# Patient Record
Sex: Male | Born: 1940 | Race: White | Hispanic: No | Marital: Married
Health system: Midwestern US, Academic
[De-identification: ages and names within clinical notes are randomized; demographics above are authoritative.]

---

## 2016-07-14 ENCOUNTER — Ambulatory Visit: Admit: 2016-07-14 | Discharge: 2016-07-14 | Payer: MEDICARE

## 2016-07-14 ENCOUNTER — Encounter: Admit: 2016-07-14 | Discharge: 2016-07-14 | Payer: MEDICARE

## 2016-07-14 DIAGNOSIS — Z8679 Personal history of other diseases of the circulatory system: ICD-10-CM

## 2016-07-14 DIAGNOSIS — E7800 Pure hypercholesterolemia, unspecified: ICD-10-CM

## 2016-07-14 DIAGNOSIS — Z95810 Presence of automatic (implantable) cardiac defibrillator: Principal | ICD-10-CM

## 2016-07-14 DIAGNOSIS — I10 Essential (primary) hypertension: ICD-10-CM

## 2016-07-14 DIAGNOSIS — Z006 Encounter for examination for normal comparison and control in clinical research program: ICD-10-CM

## 2016-07-14 DIAGNOSIS — Z905 Acquired absence of kidney: ICD-10-CM

## 2016-07-14 DIAGNOSIS — I447 Left bundle-branch block, unspecified: ICD-10-CM

## 2016-07-14 DIAGNOSIS — E785 Hyperlipidemia, unspecified: ICD-10-CM

## 2016-07-14 DIAGNOSIS — I480 Paroxysmal atrial fibrillation: ICD-10-CM

## 2016-07-14 DIAGNOSIS — I2510 Atherosclerotic heart disease of native coronary artery without angina pectoris: Principal | ICD-10-CM

## 2016-07-14 DIAGNOSIS — Z7901 Long term (current) use of anticoagulants: ICD-10-CM

## 2016-07-14 NOTE — Progress Notes
Date of Service: 07/14/2016    Justin Farmer is a 76 y.o. male.       HPI     Justin Farmer presents to my office today in electrophysiology follow-up for a history of cardiomyopathy and defibrillator.  As you know, he is a very pleasant 76 year old male with a past medical history of coronary artery disease, ischemic cardiomyopathy with a last estimated ejection fraction of 40%, paroxysmal atrial fibrillation currently on anticoagulation, hyperlipidemia, and hypertension who was last seen by his primary cardiologist Dr. Rodrigo Ran in October.  At that time, the patient was doing well without any significant complaints.  He made plans to see Mr. Wease back in follow-up in 1 year.    [name] is in my office today in routine follow-up.  He is a participant in our Wrap It study looking at the risk of infection with device implantation.  He tells me that he has had no chest pain, shortness of breath, dizziness, or syncope.  He denies palpitations.  Occasionally, he will feel his heartbeat funny at night.  His energy level is good.  Patient denies fevers, chills, and sweats.  Device interrogation today shows stable sensing and pacing thresholds.  He is 99% V paced.         Vitals:    07/14/16 0849   BP: 122/77   Pulse: 60   Weight: 92.7 kg (204 lb 6.4 oz)   Height: 1.778 m (5' 10)     Body mass index is 29.33 kg/m???.     Past Medical History  Patient Active Problem List    Diagnosis Date Noted   ??? Biventricular automatic implantable cardioverter defibrillator in situ 07/14/2016   ??? Coronary artery disease involving native coronary artery of native heart without angina pectoris 12/08/2014     1. 17/07 pharmacologic stress study: Mildly abnormal with slight degree of ischemia in mid to distal inferolateral wall in circumflex distribution. EF 49%, although visually appeared to be 55%. EDV 87 mL.  2. 02/14/05 cardiac catheterization: LAD proximal 40-50% stenosis, proximal mid portion 60% followed by 70-75% stenosis between the first and second diagonal branches. Second diagonal branch 60% ostial stenosis. Circumflex first OM with proximal 50-60% stenosis. RCA 20-30% luminal irregularity in the proximal mid segment. Normal end-diastolic pressure 6-8 mmHg. Left ventriculogram not performed.  3. 05/08/05 coronary angiogram: Left main free of significant disease, LAD proximal 30-40% stenosis followed by 70% mid stenosis between the first and second diagonal branches, 50% stenosis in the LAD at bifurcation or second diagonal branch. Second diagonal branch ostial 60% stenosis. Left circumflex no significant disease. Large obtuse marginal branch proximal 40% stenosis. Right coronary artery 20% luminal narrowing in mid RCA. FFR proximal-mid LAD at baseline 0.90, at maximal hyperemia 0.72. FFR of first obtuse marginal 0.97 at rest, decreased to 0.91 with maximum hyperemia with adenosine.??? Status post PCI and stenting of proximal to mid left anterior descending artery with 3.0- x 30.0-mm Driver bare-metal stent.  4. 05/11/2006, exercise thallium.??? The patient exercised for 8:33 minutes,achieving 89 percent of MPHR.??? Stress thallium negative for ischemia.??? His stress ECG negative for ischemia.??? EF 59 %.  5. 06/08/08: [facility] stress thallium: No significant myocardial ischemia. There is diaphragmatic attenuation/and due to hot spot artifact involving the inferior wall. EF 66%  6. 07/18/11: Probably abnormal and demonstrates a relatively small, reversible anteroseptal and apical septal defect. There is also a fixed, true apical component noted. This may suggest limited ischemia in an LAD distribution. EF 53%  7. 08/03/11: Cardiac Cath: Moderate 70% lesion right after the previously placed stent in the proximal LAD.FFR dropped from 0.86 to 0.67. Moderate 40 percent to 50 percent lesion in the large first obtuse marginal vessel, mild 30 percent lesion in the proximal to mid RCA.LAD- stented using a bare-metal stent, Integrity 3.0 x 12 mm and followed by another stent of 2.5 x 12 mm in overlapping fashion.  8. 08/25/11 Echo:??? EF 40-45%.??? Mild left atrial enlargement.??? Mild concentric LVH.??? Calcific thickening of noncoronary cusp of aortic valve with no stenosis or ???regurgitation.??? PAP=34 mmHg.  9. 06/09/13 Regadenoson Thallium:??? EF 42%.??? No definite perfusion defects.     ??? Chronic anticoagulation 12/08/2014   ??? 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 08/25/11: Echo:EF=40- 45%. Mild left atrial enlargement. Mild LVH. Calcific thickening of noncoronary cusp of aortic valve. No stenosis or regurgitation. PAP 34  2. 10/06/12 Echo:  EF 45%.  LA mildly enlarged.  3. 10/06/12: Echo: LV EF is about 45%. The LA is mildly enlarged. Mild MR/TR  4. 12/08/14 Echo:  EF 35%. Sclerotic AV with not stenosis or regurgitation.  Mildly dilated sinuses of valsalva -3.7 cm. PAP=27 mmHg.  5. 01/02/2015 CRT-D implant  6. 07/19/15 2D Echo:  EF 40%.  Sclerotic AV.  Mildly dilated sinuses al valsalva (3.9 cm)       ??? Abnormal cardiovascular stress test 07/31/2011   ??? Abnormal thallium stress test 07/31/2011   ??? Paroxysmal atrial fibrillation (HCC) 02/22/2011     01/2011: TEE: EF 40-45%. Moderate left atrial enlargement. Mild right atrial enlargement. Mild MR/TR. No thrombus is noted in the left atrial appendage or the left atrium.   02/21/11: Cardioversion: 200 J biphasic  INR followed by PCP, Lynne Leader MD - as of 08/05/11  11/06/14 Holter Monitor:  46 hour Holter monitor demonstrates sinus rhythm with rare premature atrial complex beats (21 in 46 hours) and rare premature ventricular     complex beats (29 in 46 hours).??? No episode of significant atrial or ventricular sustained arrhythmia.??? No symptoms reported.  ???     ??? LBBB (left bundle branch block) 06/05/2009     05/2008: rate related during stress test and noted again on 06/01/09 at rate of 54 bpm ??? Essential hypertension 06/07/2008   ??? 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 HLD (hyperlipidemia) 06/07/2008         Review of Systems   Constitution: Negative.   HENT: Negative.    Eyes: Negative.    Cardiovascular: Negative.    Respiratory: Negative.    Endocrine: Negative.    Hematologic/Lymphatic: Negative.    Skin: Negative.    Musculoskeletal: Negative.    Gastrointestinal: Negative.    Genitourinary: Negative.    Neurological: Negative.    Psychiatric/Behavioral: Negative.    Allergic/Immunologic: Negative.        Physical Exam  General Appearance: elderly, well nourished in no acute distress  Skin: warm, moist, no ulcers  HEENT: extraocular movements intact, oropharynx clear  Neck Veins: neck veins are flat, neck veins are not distended  Carotid Arteries: normal carotid upstroke bilaterally, no bruits  Chest Inspection: chest is normal in appearance  Auscultation/Percussion: lungs clear to auscultation, no rales, rhonchi, or wheezing  Cardiac Rhythm: regular rhythm and normal rate  Cardiac Auscultation: Normal S1 & S2, no S3 or S4, no rub  Murmurs: no cardiac murmurs   Extremities: no lower extremity edema; 1+  symmetric distal pulses  Abdominal Exam: soft, non-tender, no masses, bowel sounds normal  Liver & Spleen: no organomegaly  Neurologic Exam: neurological assessment grossly intact      Cardiovascular Studies  12 lead EKG:  AV paced rhythm, ventricular rate 60 bpm, QTc 448 msec      Problems Addressed Today  Encounter Diagnoses   Name Primary?   ??? Biventricular automatic implantable cardioverter defibrillator in situ Yes   ??? Paroxysmal atrial fibrillation (HCC)    ??? LBBB (left bundle branch block)        Assessment and Plan       Paroxysmal atrial fibrillation (HCC)  He has not had any recent atrial fibrillation.  Mr. Snide remains on anticoagulation with warfarin.  We will continue to follow him clinically. Biventricular automatic implantable cardioverter defibrillator in situ  Device interrogation today shows stable sensing and pacing thresholds.  He is LV pacing 99.6% of the time.  He has by V pacing 0.3% of the time.  We did not make any changes to his programming.      The patient is in one of our study protocols.  He has had no pain at his incision site.  The patient denies fevers, chills, or sweats.    I have not scheduled a follow up appointment at this time.  I would be happy to see the patient back in the future if the need arises.    Current Medications (including today's revisions)  ??? aspirin EC 81 mg tablet Take 1 Tab by mouth daily.   ??? atorvastatin (LIPITOR) 20 mg tablet Take 20 mg by mouth daily.     ??? HYDROcodone/acetaminophen (NORCO) 5/325 mg tablet Take 1-2 Tabs by mouth every 4 hours as needed for Pain   ??? losartan(+) (COZAAR) 100 mg tablet Take 1 tablet by mouth daily.   ??? metoprolol XL (TOPROL XL) 200 mg extended release tablet Take 1 tablet by mouth daily.   ??? nitroglycerin (NITROSTAT) 0.4 mg tablet Place 1 Tab under tongue Every 5 Min as needed for Chest Pain.   ??? tamsulosin (FLOMAX) 0.4 mg capsule Take 1 Cap by mouth Daily.   ??? VIT C/VIT E ACETATE/LUTEIN/MIN (OCUVITE LUTEIN PO) Take 1 Tab by mouth daily.   ??? warfarin (COUMADIN) 5 mg tablet Take 1 Tab by mouth at bedtime daily. as directed based on INR.

## 2016-07-14 NOTE — Assessment & Plan Note
He has not had any recent atrial fibrillation.  Justin Farmer remains on anticoagulation with warfarin.  We will continue to follow him clinically.

## 2016-07-25 ENCOUNTER — Ambulatory Visit: Admit: 2016-07-25 | Discharge: 2016-07-26 | Payer: MEDICARE

## 2016-07-26 DIAGNOSIS — Z95810 Presence of automatic (implantable) cardiac defibrillator: Principal | ICD-10-CM

## 2016-08-29 NOTE — Progress Notes
Date of Service: 08/29/2016    Justin Farmer is a 76 y.o. male.       HPI     [name] is a very pleasant 76 year old male with a history of coronary artery disease status post stenting of the left anterior descending artery with bare metal stent in 2007.  A cardiac catheterization in 2013 after an abnormal stress test was done, which demonstrated significant stenosis in the left anterior descending artery again, and he underwent stenting with overlapping 2 stents.  He has done well since then.  The ejection fraction has been about 42% on the last stress test in 2015, which did not demonstrate any significant reversible perfusion abnormalities.  Patient's left ventricular ejection fraction decreased to about 35% in 2016.  He underwent a biventricular pacing device.  His left ventricular systolic function improved and was 40% on the last check.  He came today for followup.  [place]  The patient was very emotional today, as his wife recently died.  They were married for about 58 years.  She has been followed in our clinic as well.  She suddenly had a major stroke and was in ICU for about a week, another week in the hospital.  Patient was feeling somewhat depressed and emotional and was crying at times.  His blood pressure at home has been about 130/70 to 80 mmHg.  He stated his INR has been checked through your office, and it was 3.0 on the last check.  No complaint of blood in the stool or black stool.     Patient stated his son lives nearby and he checks on him on a daily basis and also another son lives in [place] who calls him every day.  He just signed up for an emergency alarm yesterday (Life Alert).  Cholesterol was checked last year and was 147 with the LDL of 70.  He is taking all of his medications regularly and denies any muscle ache or side effect from Lipitor.     EKG demonstrates atrial paced rhythm and old inferior infarct and poor R-wave progression in anterior leads. CRT-D was checked in July, which has been functioning normally.  He is Bi-V paced 98% of the time.  There were 5 V sense episodes with the longest one for about 1 minute and 20 seconds.  The marker suggested sinus tachycardia.  PVC burden is 3.3 hours per day.     1. Coronary artery disease.  The patient denies any complaint of chest pain or shortness of breath.  He still has significant PVC burden on the device check.  His last stress test was done in 2015, and ejection fraction has been about 40% on the last echo and stress test.  We will schedule him for a pharmacological stress thallium study for further evaluation.  He is to continue with the losartan and Toprol-XL.  He is already on max doses.  He is on aspirin as well.  I encouraged him to exercise.  He has a farm and stays fairly active.  His activities are now limited as his wife passed away recently.  2. History of paroxysmal atrial fibrillation.  The patient is doing well clinically.  No high atrial rate events noted on the device check.  He is to continue with the Coumadin with a target INR between 2 and 3.  INR has been checked through your office.  No complaint of blood in the stool or black stool.  3. Hypertension.  Blood pressure is under control.  He is to continue the losartan and Toprol-XL.  4. Hypercholesterolemia.  He is to continue with the Lipitor.  He has an appointment in your office in the next month and stated he generally gets it checked through your office.  5. Patient is significantly depressed.  He denied any suicidal ideation, but there were tears in his eyes and he was talking about his wife.  I discussed with him whether he wants to be on any antidepressant, which he does not want at present.  He stated he is dealing with it and will slowly get better.  He was more emotional as she has been followed in our clinic as well.  I advised him to call your office if he feels bad and agree with starting any antidepressant.  If he remains depressed, I think he will be better off with an antidepressant.  He has been reluctant today in the clinic, but was open to further discussion.  He has lost weight and has not been eating as well as he used to.  His son is checking on him on a regular basis.    ([id number])        We appreciate the opportunity in taking care of Justin Farmer, please feel free to contact us if there is any further question.    On examination, the patient is sitting comfortably on exam bed in no respiratory distress. No cyanosis, clubbing or icterus noted. Neck is supple. No jugular venous distention, lymphadenopathy or carotid bruit. Chest is clear to auscultation. No wheeze or crackles. Cardiovascular: S1, S2 are normal. No S3, S4 or murmur. Abdomen is soft, nontender. There is no organomegaly. Neurologically, the patient is alert, oriented times 3. Strength is normal. Extremities: No pedal edema noted. Peripheral pulses, anterior and posterior tibial are 2+.     Patient Active Problem List    Diagnosis Date Noted   ??? Biventricular automatic implantable cardioverter defibrillator in situ 07/14/2016   ??? Coronary artery disease involving native coronary artery of native heart without angina pectoris 12/08/2014     1. 17/07 pharmacologic stress study: Mildly abnormal with slight degree of ischemia in mid to distal inferolateral wall in circumflex distribution. EF 49%, although visually appeared to be 55%. EDV 87 mL.  2. 02/14/05 cardiac catheterization: LAD proximal 40-50% stenosis, proximal mid portion 60% followed by 70-75% stenosis between the first and second diagonal branches. Second diagonal branch 60% ostial stenosis. Circumflex first OM with proximal 50-60% stenosis. RCA 20-30% luminal irregularity in the proximal mid segment. Normal end-diastolic pressure 6-8 mmHg. Left ventriculogram not performed. 3. 05/08/05 coronary angiogram: Left main free of significant disease, LAD proximal 30-40% stenosis followed by 70% mid stenosis between the first and second diagonal branches, 50% stenosis in the LAD at bifurcation or second diagonal branch. Second diagonal branch ostial 60% stenosis. Left circumflex no significant disease. Large obtuse marginal branch proximal 40% stenosis. Right coronary artery 20% luminal narrowing in mid RCA. FFR proximal-mid LAD at baseline 0.90, at maximal hyperemia 0.72. FFR of first obtuse marginal 0.97 at rest, decreased to 0.91 with maximum hyperemia with adenosine.??? Status post PCI and stenting of proximal to mid left anterior descending artery with 3.0- x 30.0-mm Driver bare-metal stent.  4. 05/11/2006, exercise thallium.??? The patient exercised for 8:33 minutes,achieving 89 percent of MPHR.??? Stress thallium negative for ischemia.??? His stress ECG negative for ischemia.??? EF 59 %.  5. 06/08/08: Lexicon stress thallium: No significant myocardial ischemia. There is diaphragmatic  attenuation/and due to hot spot artifact involving the inferior wall. EF 66%  6. 07/18/11: Probably abnormal and demonstrates a relatively small, reversible anteroseptal and apical septal defect. There is also a fixed, true apical component noted. This may suggest limited ischemia in an LAD distribution. EF 53%  7. 08/03/11: Cardiac Cath: Moderate 70% lesion right after the previously placed stent in the proximal LAD.FFR dropped from 0.86 to 0.67. Moderate 40 percent to 50 percent lesion in the large first obtuse marginal vessel, mild 30 percent lesion in the proximal to mid RCA.LAD- stented using a bare-metal stent, Integrity 3.0 x 12 mm and followed by another stent of 2.5 x 12 mm in overlapping fashion.  8. 08/25/11 Echo:??? EF 40-45%.??? Mild left atrial enlargement.??? Mild concentric LVH.??? Calcific thickening of noncoronary cusp of aortic valve with no stenosis or ???regurgitation.??? PAP=34 mmHg. 9. 06/09/13 Regadenoson Thallium:??? EF 42%.??? No definite perfusion defects.     ??? Chronic anticoagulation 12/08/2014   ??? 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 08/25/11: Echo:EF=40- 45%. Mild left atrial enlargement. Mild LVH. Calcific thickening of noncoronary cusp of aortic valve. No stenosis or regurgitation. PAP 34  2. 10/06/12 Echo:  EF 45%.  LA mildly enlarged.  3. 10/06/12: Echo: LV EF is about 45%. The LA is mildly enlarged. Mild MR/TR  4. 12/08/14 Echo:  EF 35%. Sclerotic AV with not stenosis or regurgitation.  Mildly dilated sinuses of valsalva -3.7 cm. PAP=27 mmHg.  5. 01/02/2015 CRT-D implant  6. 07/19/15 2D Echo:  EF 40%.  Sclerotic AV.  Mildly dilated sinuses al valsalva (3.9 cm)       ??? Abnormal cardiovascular stress test 07/31/2011   ??? Abnormal thallium stress test 07/31/2011   ??? Paroxysmal atrial fibrillation (HCC) 02/22/2011     01/2011: TEE: EF 40-45%. Moderate left atrial enlargement. Mild right atrial enlargement. Mild MR/TR. No thrombus is noted in the left atrial appendage or the left atrium.   02/21/11: Cardioversion: 200 J biphasic  INR followed by PCP, Lynne Leader MD - as of 08/05/11  11/06/14 Holter Monitor:  46 hour Holter monitor demonstrates sinus rhythm with rare premature atrial complex beats (21 in 46 hours) and rare premature ventricular     complex beats (29 in 46 hours).??? No episode of significant atrial or ventricular sustained arrhythmia.??? No symptoms reported.  ???     ??? LBBB (left bundle branch block) 06/05/2009     05/2008: rate related during stress test and noted again on 06/01/09 at rate of 54 bpm     ??? Essential hypertension 06/07/2008   ??? 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 HLD (hyperlipidemia) 06/07/2008              Vitals:    08/29/16 1046   BP: 116/82   Pulse: 60   Weight: 92.4 kg (203 lb 12.8 oz)   Height: 1.778 m (5' 10)     Body mass index is 29.24 kg/m???.     Past Medical History  Patient Active Problem List Diagnosis Date Noted   ??? Biventricular automatic implantable cardioverter defibrillator in situ 07/14/2016   ??? Coronary artery disease involving native coronary artery of native heart without angina pectoris 12/08/2014     10. 17/07 pharmacologic stress study: Mildly abnormal with slight degree of ischemia in mid to distal inferolateral wall in circumflex distribution. EF 49%, although visually appeared to be 55%. EDV 87 mL.  11. 02/14/05 cardiac catheterization: LAD proximal 40-50% stenosis, proximal mid portion 60% followed by 70-75% stenosis between the first and second diagonal branches. Second diagonal branch 60% ostial stenosis. Circumflex first OM with proximal 50-60% stenosis. RCA 20-30% luminal irregularity in the proximal mid segment. Normal end-diastolic pressure 6-8 mmHg. Left ventriculogram not performed.  12. 05/08/05 coronary angiogram: Left main free of significant disease, LAD proximal 30-40% stenosis followed by 70% mid stenosis between the first and second diagonal branches, 50% stenosis in the LAD at bifurcation or second diagonal branch. Second diagonal branch ostial 60% stenosis. Left circumflex no significant disease. Large obtuse marginal branch proximal 40% stenosis. Right coronary artery 20% luminal narrowing in mid RCA. FFR proximal-mid LAD at baseline 0.90, at maximal hyperemia 0.72. FFR of first obtuse marginal 0.97 at rest, decreased to 0.91 with maximum hyperemia with adenosine.??? Status post PCI and stenting of proximal to mid left anterior descending artery with 3.0- x 30.0-mm Driver bare-metal stent.  13. 05/11/2006, exercise thallium.??? The patient exercised for 8:33 minutes,achieving 89 percent of MPHR.??? Stress thallium negative for ischemia.??? His stress ECG negative for ischemia.??? EF 59 %.  14. 06/08/08: Lexicon stress thallium: No significant myocardial ischemia. There is diaphragmatic attenuation/and due to hot spot artifact involving the inferior wall. EF 66% 15. 07/18/11: Probably abnormal and demonstrates a relatively small, reversible anteroseptal and apical septal defect. There is also a fixed, true apical component noted. This may suggest limited ischemia in an LAD distribution. EF 53%  16. 08/03/11: Cardiac Cath: Moderate 70% lesion right after the previously placed stent in the proximal LAD.FFR dropped from 0.86 to 0.67. Moderate 40 percent to 50 percent lesion in the large first obtuse marginal vessel, mild 30 percent lesion in the proximal to mid RCA.LAD- stented using a bare-metal stent, Integrity 3.0 x 12 mm and followed by another stent of 2.5 x 12 mm in overlapping fashion.  17. 08/25/11 Echo:??? EF 40-45%.??? Mild left atrial enlargement.??? Mild concentric LVH.??? Calcific thickening of noncoronary cusp of aortic valve with no stenosis or ???regurgitation.??? PAP=34 mmHg.  18. 06/09/13 Regadenoson Thallium:??? EF 42%.??? No definite perfusion defects.     ??? Chronic anticoagulation 12/08/2014   ??? Ischemic cardiomyopathy 01/14/2012     7. 08/25/11: Echo:EF=40- 45%. Mild left atrial enlargement. Mild LVH. Calcific thickening of noncoronary cusp of aortic valve. No stenosis or regurgitation. PAP 34  8. 10/06/12 Echo:  EF 45%.  LA mildly enlarged.  9. 10/06/12: Echo: LV EF is about 45%. The LA is mildly enlarged. Mild MR/TR  10. 12/08/14 Echo:  EF 35%. Sclerotic AV with not stenosis or regurgitation.  Mildly dilated sinuses of valsalva -3.7 cm. PAP=27 mmHg.  11. 01/02/2015 CRT-D implant  12. 07/19/15 2D Echo:  EF 40%.  Sclerotic AV.  Mildly dilated sinuses al valsalva (3.9 cm)       ??? Abnormal cardiovascular stress test 07/31/2011   ??? Abnormal thallium stress test 07/31/2011   ??? Paroxysmal atrial fibrillation (HCC) 02/22/2011     01/2011: TEE: EF 40-45%. Moderate left atrial enlargement. Mild right atrial enlargement. Mild MR/TR. No thrombus is noted in the left atrial appendage or the left atrium.   02/21/11: Cardioversion: 200 J biphasic INR followed by PCP, Lynne Leader MD - as of 08/05/11  11/06/14 Holter Monitor:  46 hour Holter monitor demonstrates sinus rhythm with rare premature atrial complex beats (21 in 46 hours) and rare premature ventricular     complex beats (29 in 46 hours).??? No episode of significant atrial or ventricular  sustained arrhythmia.??? No symptoms reported.  ???     ??? LBBB (left bundle branch block) 06/05/2009     05/2008: rate related during stress test and noted again on 06/01/09 at rate of 54 bpm     ??? Essential hypertension 06/07/2008   ??? 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 HLD (hyperlipidemia) 06/07/2008         Review of Systems   Constitution: Positive for decreased appetite.   HENT: Negative.    Eyes: Negative.    Cardiovascular: Negative.    Respiratory: Negative.    Endocrine: Negative.    Hematologic/Lymphatic: Negative.    Skin: Negative.    Musculoskeletal: Negative.    Gastrointestinal: Negative.    Genitourinary: Negative.    Neurological: Positive for dizziness, focal weakness and headaches.   Psychiatric/Behavioral: The patient has insomnia.    Allergic/Immunologic: Negative.        Physical Exam      Cardiovascular Studies      Problems Addressed Today  Encounter Diagnoses   Name Primary?   ??? Coronary artery disease involving native coronary artery of native heart without angina pectoris Yes   ??? Essential hypertension    ??? Hyperlipidemia, unspecified hyperlipidemia type    ??? Ischemic cardiomyopathy        Assessment and Plan              Current Medications (including today's revisions)  ??? aspirin EC 81 mg tablet Take 1 Tab by mouth daily.   ??? atorvastatin (LIPITOR) 20 mg tablet Take 20 mg by mouth daily.     ??? HYDROcodone/acetaminophen (NORCO) 5/325 mg tablet Take 1-2 Tabs by mouth every 4 hours as needed for Pain   ??? losartan(+) (COZAAR) 100 mg tablet Take 1 tablet by mouth daily.   ??? metoprolol XL (TOPROL XL) 200 mg extended release tablet Take 1 tablet by mouth daily.   ??? nitroglycerin (NITROSTAT) 0.4 mg tablet Place 1 Tab under tongue Every 5 Min as needed for Chest Pain.   ??? tamsulosin (FLOMAX) 0.4 mg capsule Take 1 Cap by mouth Daily.   ??? VIT C/VIT E ACETATE/LUTEIN/MIN (OCUVITE LUTEIN PO) Take 1 Tab by mouth daily.   ??? warfarin (COUMADIN) 5 mg tablet Take 1 Tab by mouth at bedtime daily. as directed based on INR.

## 2016-08-30 ENCOUNTER — Ambulatory Visit: Admit: 2016-08-29 | Discharge: 2016-08-30 | Payer: MEDICARE

## 2016-08-30 DIAGNOSIS — I10 Essential (primary) hypertension: ICD-10-CM

## 2016-08-30 DIAGNOSIS — I2510 Atherosclerotic heart disease of native coronary artery without angina pectoris: Principal | ICD-10-CM

## 2016-08-30 DIAGNOSIS — I255 Ischemic cardiomyopathy: Secondary | ICD-10-CM

## 2016-08-30 DIAGNOSIS — Z7901 Long term (current) use of anticoagulants: ICD-10-CM

## 2016-08-30 DIAGNOSIS — I480 Paroxysmal atrial fibrillation: ICD-10-CM

## 2016-08-30 DIAGNOSIS — E7800 Pure hypercholesterolemia, unspecified: ICD-10-CM

## 2016-08-30 DIAGNOSIS — F329 Major depressive disorder, single episode, unspecified: ICD-10-CM

## 2016-09-11 ENCOUNTER — Encounter: Admit: 2016-09-11 | Discharge: 2016-09-11 | Payer: MEDICARE

## 2016-09-11 NOTE — Telephone Encounter
Patient called stating that he ordered and received an alert monitor if he falls/needs help.  He recently lost his wife and lives alone and wants to make sure this won't interfere with his ICD.  Reviewed with device nurse and was told this will not.  Notified patient.

## 2016-09-16 ENCOUNTER — Encounter: Admit: 2016-09-16 | Discharge: 2016-09-16 | Payer: MEDICARE

## 2016-09-18 ENCOUNTER — Ambulatory Visit: Admit: 2016-09-18 | Discharge: 2016-09-18 | Payer: MEDICARE

## 2016-09-18 DIAGNOSIS — I10 Essential (primary) hypertension: Secondary | ICD-10-CM

## 2016-09-18 DIAGNOSIS — I2510 Atherosclerotic heart disease of native coronary artery without angina pectoris: Principal | ICD-10-CM

## 2016-09-18 DIAGNOSIS — I255 Ischemic cardiomyopathy: ICD-10-CM

## 2016-09-18 MED ORDER — AMINOPHYLLINE 500 MG/20 ML IV SOLN
50 mg | INTRAVENOUS | 0 refills | Status: AC | PRN
Start: 2016-09-18 — End: ?

## 2016-09-18 MED ORDER — SODIUM CHLORIDE 0.9 % IV SOLP
250 mL | INTRAVENOUS | 0 refills | Status: AC | PRN
Start: 2016-09-18 — End: ?

## 2016-09-18 MED ORDER — NITROGLYCERIN 0.4 MG SL SUBL
.4 mg | SUBLINGUAL | 0 refills | Status: AC | PRN
Start: 2016-09-18 — End: ?

## 2016-09-18 MED ORDER — ALBUTEROL SULFATE 90 MCG/ACTUATION IN HFAA
2 | RESPIRATORY_TRACT | 0 refills | Status: DC | PRN
Start: 2016-09-18 — End: 2016-09-23

## 2016-09-18 MED ORDER — REGADENOSON 0.4 MG/5 ML IV SYRG
.4 mg | Freq: Once | INTRAVENOUS | 0 refills | Status: CP
Start: 2016-09-18 — End: ?
  Administered 2016-09-18: 16:00:00 0.4 mg via INTRAVENOUS

## 2016-09-18 NOTE — Progress Notes
Peripheral IV Insertion Note:  Patient Side: left  Line Orientation:Antecubital  IV Catheter Size: 22G  Number of Attempts:1.  IV capped and flushed with Normal Saline.  IV site without redness, swelling, or pain.  New dressing placed.    After procedure IV cannula removed intact and hemostasis achieved.

## 2016-09-23 ENCOUNTER — Encounter: Admit: 2016-09-23 | Discharge: 2016-09-23 | Payer: MEDICARE

## 2016-09-23 NOTE — Telephone Encounter
-----   Message from Lenice Llamas, MD sent at 09/20/2016  8:12 PM CDT -----  Stress test is ok. Please let the patient know.

## 2016-09-23 NOTE — Telephone Encounter
Spoke with pt and he was given the information as below. Printed results and mailed to pt as he requested

## 2016-10-08 ENCOUNTER — Encounter: Admit: 2016-10-08 | Discharge: 2016-10-08 | Payer: MEDICARE

## 2016-10-08 DIAGNOSIS — M25512 Pain in left shoulder: Principal | ICD-10-CM

## 2016-10-20 ENCOUNTER — Ambulatory Visit: Admit: 2016-10-20 | Discharge: 2016-10-20 | Payer: MEDICARE

## 2016-10-20 ENCOUNTER — Encounter: Admit: 2016-10-20 | Discharge: 2016-10-20 | Payer: MEDICARE

## 2016-10-20 ENCOUNTER — Ambulatory Visit: Admit: 2016-10-20 | Discharge: 2016-10-21 | Payer: MEDICARE

## 2016-10-20 DIAGNOSIS — M25512 Pain in left shoulder: Principal | ICD-10-CM

## 2016-10-20 DIAGNOSIS — M75102 Unspecified rotator cuff tear or rupture of left shoulder, not specified as traumatic: Principal | ICD-10-CM

## 2016-10-20 DIAGNOSIS — I255 Ischemic cardiomyopathy: Principal | ICD-10-CM

## 2016-10-20 DIAGNOSIS — Z95810 Presence of automatic (implantable) cardiac defibrillator: ICD-10-CM

## 2016-10-20 NOTE — Progress Notes
Date of Service: 10/20/2016      Chief Complaint   Justin Farmer presents with   ??? Shoulder Pain     Left          HPI -Justin Farmer is a pleasant 76 year old male who presents to clinic for evaluation treatment of the left shoulder injury.  Justin Farmer reports he was walking in a store in April of this year when he slipped and fell onto his left side injuring his shoulder and hip on that side.  Since that point time he has been unable to use that left shoulder minimally.  He endorses pain with range of motion as well as some weakness.  He says prior to the injury he was able to do his activities as tolerated with the shoulder.  He lives outside in the [place], [place] and works on his farm raising cattle.  He continues to do this despite his shoulder pain and weakness.  He is previously seen a physician who performed 2 corticosteroid injections which did provide some relief to him.  He denies antecedent shoulder issues before this.  He is right-hand dominant.    Physical exam:  Constitutional: Alert, NAD  Psychiatric: Mood and affect appropriate  Eyes: EOMI  Respiratory: Unlabored breathing  Cardiovascular: Regular rate  Skin: No rashes or open wounds appreciated   MSK: Left shoulder: Some atrophy about his shoulder; range of motion forward flexion to proximally 130 though this is painful; abduction to about 110; internal rotation to approximately T12 external rotation to approximately 40 degrees; significant weakness to infraspinatus with a 15 degree external rotation lag; weakness to teres minor with a positive horn blower sign; positive Speed and Yergason test; positive Neer test    Radiographs: Radiographs reviewed which show no fracture or dislocation in his left shoulder; there is some cystic change along his greater tuberosity; really minimal glenohumeral arthritis    Assessment/plan: 76 year old male with 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  Justin Farmer is interested in pursuing treatment for this even if that does require surgical management.  At this point and will order an MRI of his left shoulder to guide her treatment plan.   Of note, Justin Farmer does have a pacemaker was placed in 2016 at [facility] so we will attempt to get this MRI here and ensure that it is MRI compatible. Justin Farmer will follow-up after completion of this MRI.      Please send a copy of this to Dr. Perlie Gold in [place], [place]           I have personally reviewed the Justin Farmer intake form with the Justin Farmer today, it was signed by me and scanned into O2. Please see below for details.         Past Medical History:  Past Medical History:   Diagnosis Date   ??? CAD (coronary artery disease) 06/07/2008   ??? Chronic anticoagulation, on Pradaxa 07/31/2011   ??? History of atrial fibrillation 02/22/2011    01/2011: TEE: EF 40-45%. Moderate left atrial enlargement. Mild right atrial enlargement. Mild MR/TR. No thrombus is noted in the left atrial appendage or the left atrium.  02/21/11: Cardioversion: 200 J biphasic    ??? 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 HLD (hyperlipidemia) 06/07/2008   ??? HTN (hypertension) 06/07/2008   ??? LBBB (left bundle branch block) 06/05/2009    05/2008: rate related  during stress test and noted again on 06/01/09 at rate of 54 bpm        No past surgical history on file.    Allergies:  Morphine    Current Medications:  ??? aspirin EC 81 mg tablet Take 1 Tab by mouth daily.   ??? atorvastatin (LIPITOR) 20 mg tablet Take 20 mg by mouth daily.     ??? HYDROcodone/acetaminophen (NORCO) 5/325 mg tablet Take 1-2 Tabs by mouth every 4 hours as needed for Pain   ??? losartan(+) (COZAAR) 100 mg tablet Take 1 tablet by mouth daily.   ??? metoprolol XL (TOPROL XL) 200 mg extended release tablet Take 1 tablet by mouth daily. ??? nitroglycerin (NITROSTAT) 0.4 mg tablet Place 1 Tab under tongue Every 5 Min as needed for Chest Pain.   ??? tamsulosin (FLOMAX) 0.4 mg capsule Take 1 Cap by mouth Daily.   ??? VIT C/VIT E ACETATE/LUTEIN/MIN (OCUVITE LUTEIN PO) Take 1 Tab by mouth daily.   ??? warfarin (COUMADIN) 5 mg tablet Take 1 Tab by mouth at bedtime daily. as directed based on INR.       Social History:  History   Smoking Status   ??? Never Smoker   Smokeless Tobacco   ??? Not on file     History   Drug Use No     History   Alcohol Use No       No family history on file.    Review Of Systems:  A 14 point review of systems including HEENT, cardiovascular, pulmonary, gastrointestinal,?genitourinary, psychiatric, neurologic, musculoskeletal, endocrine, and integumentary are negative unless otherwise noted in the history of present illness or on the signed intake form.        Objective:           Vitals:    10/20/16 1355   BP: 173/83   Pulse: 60   Weight: 92.1 kg (203 lb)   Height: 177.8 cm (70)     Body mass index is 29.13 kg/m???.     ATTESTATION  I personally performed the E/M including history, physical exam, and MDM.    Staff name:  Abran Duke, MD Date:  10/20/2016          Abran Duke, MD    Portions of this noted may have been created using Dragon, a voice recognition software.  Please contact my office for any clarification of documentation    Please send a copy of office notes to the primary care physician and referring providers

## 2016-10-24 ENCOUNTER — Ambulatory Visit: Admit: 2016-10-24 | Discharge: 2016-10-25 | Payer: MEDICARE

## 2016-10-24 DIAGNOSIS — Z95810 Presence of automatic (implantable) cardiac defibrillator: Secondary | ICD-10-CM

## 2016-10-25 DIAGNOSIS — I5022 Chronic systolic (congestive) heart failure: Principal | ICD-10-CM

## 2016-10-31 ENCOUNTER — Encounter: Admit: 2016-10-31 | Discharge: 2016-10-31 | Payer: MEDICARE

## 2016-10-31 MED ORDER — METOPROLOL SUCCINATE 200 MG PO TB24
200 mg | ORAL_TABLET | Freq: Every day | ORAL | 3 refills | 90.00000 days | Status: AC
Start: 2016-10-31 — End: 2017-10-30

## 2016-11-17 ENCOUNTER — Ambulatory Visit: Admit: 2016-11-17 | Discharge: 2016-11-17 | Payer: MEDICARE

## 2016-11-17 ENCOUNTER — Encounter: Admit: 2016-11-17 | Discharge: 2016-11-17 | Payer: MEDICARE

## 2016-11-17 DIAGNOSIS — M12812 Other specific arthropathies, not elsewhere classified, left shoulder: Principal | ICD-10-CM

## 2016-11-17 DIAGNOSIS — Z95810 Presence of automatic (implantable) cardiac defibrillator: ICD-10-CM

## 2016-11-17 DIAGNOSIS — Z8679 Personal history of other diseases of the circulatory system: ICD-10-CM

## 2016-11-17 DIAGNOSIS — M75102 Unspecified rotator cuff tear or rupture of left shoulder, not specified as traumatic: Principal | ICD-10-CM

## 2016-11-17 DIAGNOSIS — I10 Essential (primary) hypertension: ICD-10-CM

## 2016-11-17 DIAGNOSIS — Z905 Acquired absence of kidney: ICD-10-CM

## 2016-11-17 DIAGNOSIS — I255 Ischemic cardiomyopathy: ICD-10-CM

## 2016-11-17 DIAGNOSIS — E785 Hyperlipidemia, unspecified: ICD-10-CM

## 2016-11-17 DIAGNOSIS — I447 Left bundle-branch block, unspecified: ICD-10-CM

## 2016-11-17 DIAGNOSIS — Z7901 Long term (current) use of anticoagulants: ICD-10-CM

## 2016-11-17 DIAGNOSIS — I2510 Atherosclerotic heart disease of native coronary artery without angina pectoris: Principal | ICD-10-CM

## 2016-11-17 NOTE — Progress Notes
Pt completed MRI. EP nurse, [facility], placed pacemaker to original settings. VSS.

## 2016-11-17 NOTE — Progress Notes
Patient here for MRI with conditional pacemaker. Pt placed on monitors and EP nurse, Micki, placed pacemaker settings to AOO 60 bpm for duration of scan. VSS.

## 2016-11-19 NOTE — Progress Notes
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  The tear involved the supraspinatus and it appeared half the infraspinatus as well.  He had marked fatty atrophy greater than 50% of the muscle belly.  The patient continues to have pain.  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  He has previously had cortisone injections.  He says he needs to feed the cattle through the winter and he would like to consider possibly having surgery in the spring.    PHYSICAL EXAMINATION:  On physical examination, the patient is alert and oriented times three.  The patient is in no apparent distress.  Regular rate and rhythm breathing.    Left upper extremity ??? The left shoulder has some atrophy.  Range of motion reveals forward elevation is to about 120??? and internal rotation to about T12.  External rotation is to about 20???.  He has about a 15??? external rotation lag.  He has 3-4/5 strength external rotation and then 3/5 strength supraspinatus.  The skin is intact.    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  At this time, I do not necessarily think a rotator cuff repair would work.  I would recommend probably a reverse total shoulder for the type of function he would like to do in the future.  He agreed with this plan.  He would like to have this done probably in April.  I am going to put a consult in for either Dr. Dickie La or Dr. Caryn Section to consider this in the future.        BV/abc:kap Abran Duke, MD      BP 133/79  - Pulse 60  - Ht 177.8 cm (70)  - Wt 92.1 kg (203 lb)  - BMI 29.13 kg/m???     No orders of the defined types were placed in this encounter.      Current Medications: ??? aspirin EC 81 mg tablet Take 1 Tab by mouth daily.   ??? atorvastatin (LIPITOR) 20 mg tablet Take 20 mg by mouth daily.     ??? HYDROcodone/acetaminophen (NORCO) 5/325 mg tablet Take 1-2 Tabs by mouth every 4 hours as needed for Pain   ??? losartan(+) (COZAAR) 100 mg tablet Take 1 tablet by mouth daily.   ??? metoprolol XL (TOPROL XL) 200 mg extended release tablet Take one tablet by mouth daily.   ??? nitroglycerin (NITROSTAT) 0.4 mg tablet Place 1 Tab under tongue Every 5 Min as needed for Chest Pain.   ??? tamsulosin (FLOMAX) 0.4 mg capsule Take 1 Cap by mouth Daily.   ??? VIT C/VIT E ACETATE/LUTEIN/MIN (OCUVITE LUTEIN PO) Take 1 Tab by mouth daily.   ??? warfarin (COUMADIN) 5 mg tablet Take 1 Tab by mouth at bedtime daily. as directed based on INR.     Allergies   Allergen Reactions   ??? Morphine UNKNOWN     Hallucinated years ago with IV morphine         ATTESTATION    I personally performed the E/M including history, physical exam, and MDM.    Staff name:  Abran Duke, MD Date:  11/19/2016  Abran Duke, MD    Portions of this noted may have been created using Dragon, a voice recognition software.  Please contact my office for any clarification of documentation.    Please send a copy of office notes to the primary care physician and referring providers.

## 2017-01-07 ENCOUNTER — Encounter: Admit: 2017-01-07 | Discharge: 2017-01-07 | Payer: MEDICARE

## 2017-01-07 DIAGNOSIS — I10 Essential (primary) hypertension: Principal | ICD-10-CM

## 2017-01-07 NOTE — Telephone Encounter
Patient called stating his BP has been high. Friday he went to the ED in town for his BP being 200/90. They provided him Clonidine and BP came down. Patient stated that he had a similar episode last night when his BP was 205/90. He went to the ED again and they provided him a script for Clonidine 0.1 mg/three times a day as need for BP>165/95 (added to medication list).    He stated that again today his BP climbed to 190/77 and he took a Clonidine tablet about 30 minutes ago. He stated that he has not checked his BP since dose, but the medication has been bringing his BP down. He confirmed that he is taking Metoprolol XL 200 mg/morning and Losartan 100 mg/nightly.    Patient stated that he did have a headache last night when his BP was high. No headaches today. Denies SOB, CP, dizziness, and lightheadedness. I stated I would notify Dr. Benedict Needy for further recommendations and call back with review. Patient verbalized understanding.

## 2017-01-08 MED ORDER — HYDROCHLOROTHIAZIDE 25 MG PO TAB
25 mg | ORAL_TABLET | Freq: Every morning | ORAL | 3 refills | 28.00000 days | Status: AC
Start: 2017-01-08 — End: 2018-02-18

## 2017-01-08 NOTE — Telephone Encounter
Dr. Benedict Needy sent below response. 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 Dr. Benedict Needy has openings in the state office 1/3.    Lenice Llamas, MD    01/07/17 5:12 PM   Start HCTZ 25 mg daily. Check BMP in 10 days. Please make follow up in 3 -4 weeks

## 2017-01-08 NOTE — Telephone Encounter
Discussed with patient the new medication and confirmed pharmacy. Sent order to pharmacy.    Patient agreed to having lab drawn in approximately 10 days. I stated I would send order via mail and he stated he would have completed at local lab.    Offered patient and appointment at state office and he stated he comes to the [place] office. Dr. Jacqlyn Larsen first clinic day at [facility] is 1/24. Patient added to schedule at 11:20. I stated I would call back if Dr. Benedict Needy would like the patient to be seen at another time. Patient verbalized understanding.

## 2017-01-12 NOTE — Telephone Encounter
High Blood Pressure (Starting HCTZ)     Lenice Llamas, MD  You 4 days ago      That is ok.  (Routing comment)

## 2017-01-21 ENCOUNTER — Encounter: Admit: 2017-01-21 | Discharge: 2017-01-21 | Payer: MEDICARE

## 2017-01-23 ENCOUNTER — Ambulatory Visit: Admit: 2017-01-23 | Discharge: 2017-01-24 | Payer: MEDICARE

## 2017-01-23 DIAGNOSIS — I5022 Chronic systolic (congestive) heart failure: Principal | ICD-10-CM

## 2017-01-24 DIAGNOSIS — Z95810 Presence of automatic (implantable) cardiac defibrillator: ICD-10-CM

## 2017-01-26 ENCOUNTER — Ambulatory Visit: Admit: 2017-01-26 | Discharge: 2017-01-26 | Payer: MEDICARE

## 2017-01-26 ENCOUNTER — Encounter: Admit: 2017-01-26 | Discharge: 2017-01-26 | Payer: MEDICARE

## 2017-01-26 DIAGNOSIS — E785 Hyperlipidemia, unspecified: ICD-10-CM

## 2017-01-26 DIAGNOSIS — Z7901 Long term (current) use of anticoagulants: ICD-10-CM

## 2017-01-26 DIAGNOSIS — I10 Essential (primary) hypertension: ICD-10-CM

## 2017-01-26 DIAGNOSIS — M75122 Complete rotator cuff tear or rupture of left shoulder, not specified as traumatic: Principal | ICD-10-CM

## 2017-01-26 DIAGNOSIS — I2510 Atherosclerotic heart disease of native coronary artery without angina pectoris: Principal | ICD-10-CM

## 2017-01-26 DIAGNOSIS — I447 Left bundle-branch block, unspecified: ICD-10-CM

## 2017-01-26 DIAGNOSIS — Z905 Acquired absence of kidney: ICD-10-CM

## 2017-01-26 DIAGNOSIS — Z8679 Personal history of other diseases of the circulatory system: ICD-10-CM

## 2017-01-28 ENCOUNTER — Encounter: Admit: 2017-01-28 | Discharge: 2017-01-28 | Payer: MEDICARE

## 2017-01-28 LAB — COMPREHENSIVE METABOLIC PANEL
Lab: 100
Lab: 135

## 2017-01-28 MED ORDER — LOSARTAN 100 MG PO TAB
100 mg | ORAL_TABLET | Freq: Every day | ORAL | 2 refills | 30.00000 days | Status: AC
Start: 2017-01-28 — End: 2017-10-30

## 2017-02-04 ENCOUNTER — Encounter: Admit: 2017-02-04 | Discharge: 2017-02-04 | Payer: MEDICARE

## 2017-02-05 ENCOUNTER — Encounter: Admit: 2017-02-05 | Discharge: 2017-02-05 | Payer: MEDICARE

## 2017-02-10 MED ORDER — TRIAMCINOLONE ACETONIDE 40 MG/ML IJ SUSP
40 mg | Freq: Once | INTRAMUSCULAR | 0 refills | Status: CP | PRN
Start: 2017-02-10 — End: ?

## 2017-02-10 MED ORDER — BUPIVACAINE (PF) 0.25 % (2.5 MG/ML) IJ SOLN
7 mL | Freq: Once | INTRAMUSCULAR | 0 refills | Status: CP | PRN
Start: 2017-02-10 — End: ?

## 2017-02-13 ENCOUNTER — Ambulatory Visit: Admit: 2017-02-13 | Discharge: 2017-02-14 | Payer: MEDICARE

## 2017-02-13 DIAGNOSIS — I480 Paroxysmal atrial fibrillation: Secondary | ICD-10-CM

## 2017-02-13 DIAGNOSIS — Z006 Encounter for examination for normal comparison and control in clinical research program: ICD-10-CM

## 2017-02-14 DIAGNOSIS — I10 Essential (primary) hypertension: ICD-10-CM

## 2017-02-14 DIAGNOSIS — I2510 Atherosclerotic heart disease of native coronary artery without angina pectoris: Principal | ICD-10-CM

## 2017-02-14 DIAGNOSIS — Z7901 Long term (current) use of anticoagulants: ICD-10-CM

## 2017-02-14 DIAGNOSIS — E7800 Pure hypercholesterolemia, unspecified: ICD-10-CM

## 2017-04-23 ENCOUNTER — Encounter: Admit: 2017-04-23 | Discharge: 2017-04-23 | Payer: MEDICARE

## 2017-04-23 DIAGNOSIS — I2510 Atherosclerotic heart disease of native coronary artery without angina pectoris: Principal | ICD-10-CM

## 2017-04-23 DIAGNOSIS — I447 Left bundle-branch block, unspecified: ICD-10-CM

## 2017-04-23 DIAGNOSIS — Z7901 Long term (current) use of anticoagulants: ICD-10-CM

## 2017-04-23 DIAGNOSIS — E785 Hyperlipidemia, unspecified: ICD-10-CM

## 2017-04-23 DIAGNOSIS — I10 Essential (primary) hypertension: ICD-10-CM

## 2017-04-23 DIAGNOSIS — Z8679 Personal history of other diseases of the circulatory system: ICD-10-CM

## 2017-04-23 DIAGNOSIS — Z905 Acquired absence of kidney: ICD-10-CM

## 2017-04-28 ENCOUNTER — Encounter: Admit: 2017-04-28 | Discharge: 2017-04-28 | Payer: MEDICARE

## 2017-05-05 ENCOUNTER — Ambulatory Visit: Admit: 2017-05-05 | Discharge: 2017-05-06 | Payer: MEDICARE

## 2017-05-05 ENCOUNTER — Encounter: Admit: 2017-05-05 | Discharge: 2017-05-05 | Payer: MEDICARE

## 2017-05-06 ENCOUNTER — Encounter: Admit: 2017-05-06 | Discharge: 2017-05-06 | Payer: MEDICARE

## 2017-05-06 DIAGNOSIS — Z95810 Presence of automatic (implantable) cardiac defibrillator: ICD-10-CM

## 2017-05-06 DIAGNOSIS — I255 Ischemic cardiomyopathy: Principal | ICD-10-CM

## 2017-08-05 ENCOUNTER — Encounter: Admit: 2017-08-05 | Discharge: 2017-08-05 | Payer: MEDICARE

## 2017-08-05 ENCOUNTER — Ambulatory Visit: Admit: 2017-08-05 | Discharge: 2017-08-06 | Payer: MEDICARE

## 2017-08-06 ENCOUNTER — Encounter: Admit: 2017-08-06 | Discharge: 2017-08-06 | Payer: MEDICARE

## 2017-08-06 DIAGNOSIS — Z95810 Presence of automatic (implantable) cardiac defibrillator: Principal | ICD-10-CM

## 2017-08-13 ENCOUNTER — Encounter: Admit: 2017-08-13 | Discharge: 2017-08-13 | Payer: MEDICARE

## 2017-09-09 ENCOUNTER — Encounter: Admit: 2017-09-09 | Discharge: 2017-09-09 | Payer: MEDICARE

## 2017-09-11 ENCOUNTER — Encounter: Admit: 2017-09-11 | Discharge: 2017-09-11 | Payer: MEDICARE

## 2017-09-18 ENCOUNTER — Encounter: Admit: 2017-09-18 | Discharge: 2017-09-18 | Payer: MEDICARE

## 2017-09-24 ENCOUNTER — Encounter: Admit: 2017-09-24 | Discharge: 2017-09-24 | Payer: MEDICARE

## 2017-09-29 ENCOUNTER — Encounter: Admit: 2017-09-29 | Discharge: 2017-09-29 | Payer: MEDICARE

## 2017-09-29 DIAGNOSIS — Z95810 Presence of automatic (implantable) cardiac defibrillator: Principal | ICD-10-CM

## 2017-10-13 ENCOUNTER — Encounter: Admit: 2017-10-13 | Discharge: 2017-10-13 | Payer: MEDICARE

## 2017-10-28 ENCOUNTER — Encounter: Admit: 2017-10-28 | Discharge: 2017-10-28 | Payer: MEDICARE

## 2017-10-28 ENCOUNTER — Ambulatory Visit: Admit: 2017-10-28 | Discharge: 2017-10-28 | Payer: MEDICARE

## 2017-10-28 DIAGNOSIS — I447 Left bundle-branch block, unspecified: ICD-10-CM

## 2017-10-28 DIAGNOSIS — E7800 Pure hypercholesterolemia, unspecified: ICD-10-CM

## 2017-10-28 DIAGNOSIS — I10 Essential (primary) hypertension: ICD-10-CM

## 2017-10-28 DIAGNOSIS — I480 Paroxysmal atrial fibrillation: ICD-10-CM

## 2017-10-28 DIAGNOSIS — I2510 Atherosclerotic heart disease of native coronary artery without angina pectoris: Principal | ICD-10-CM

## 2017-10-28 DIAGNOSIS — I255 Ischemic cardiomyopathy: ICD-10-CM

## 2017-10-28 DIAGNOSIS — E785 Hyperlipidemia, unspecified: ICD-10-CM

## 2017-10-28 DIAGNOSIS — I5022 Chronic systolic (congestive) heart failure: Principal | ICD-10-CM

## 2017-10-28 DIAGNOSIS — Z7901 Long term (current) use of anticoagulants: ICD-10-CM

## 2017-10-28 DIAGNOSIS — Z95810 Presence of automatic (implantable) cardiac defibrillator: ICD-10-CM

## 2017-10-28 DIAGNOSIS — Z905 Acquired absence of kidney: ICD-10-CM

## 2017-10-28 DIAGNOSIS — Z8679 Personal history of other diseases of the circulatory system: ICD-10-CM

## 2017-10-30 ENCOUNTER — Encounter: Admit: 2017-10-30 | Discharge: 2017-10-30 | Payer: MEDICARE

## 2017-10-30 MED ORDER — METOPROLOL SUCCINATE 200 MG PO TB24
200 mg | ORAL_TABLET | Freq: Every day | ORAL | 3 refills | 90.00000 days | Status: AC
Start: 2017-10-30 — End: ?

## 2017-10-30 MED ORDER — LOSARTAN 100 MG PO TAB
ORAL_TABLET | Freq: Every day | ORAL | 3 refills | 30.00000 days | Status: AC
Start: 2017-10-30 — End: 2019-03-14

## 2017-11-04 ENCOUNTER — Encounter: Admit: 2017-11-04 | Discharge: 2017-11-04 | Payer: MEDICARE

## 2017-11-04 ENCOUNTER — Ambulatory Visit: Admit: 2017-11-04 | Discharge: 2017-11-05 | Payer: MEDICARE

## 2017-11-04 DIAGNOSIS — Z95810 Presence of automatic (implantable) cardiac defibrillator: Secondary | ICD-10-CM

## 2017-11-05 DIAGNOSIS — I255 Ischemic cardiomyopathy: Principal | ICD-10-CM

## 2018-02-03 ENCOUNTER — Ambulatory Visit: Admit: 2018-02-03 | Discharge: 2018-02-04 | Payer: MEDICARE

## 2018-02-04 ENCOUNTER — Encounter: Admit: 2018-02-04 | Discharge: 2018-02-04 | Payer: MEDICARE

## 2018-02-04 DIAGNOSIS — Z95810 Presence of automatic (implantable) cardiac defibrillator: Secondary | ICD-10-CM

## 2018-02-04 DIAGNOSIS — I255 Ischemic cardiomyopathy: Secondary | ICD-10-CM

## 2018-02-12 ENCOUNTER — Encounter: Admit: 2018-02-12 | Discharge: 2018-02-12 | Payer: MEDICARE

## 2018-02-12 DIAGNOSIS — I255 Ischemic cardiomyopathy: Secondary | ICD-10-CM

## 2018-02-12 DIAGNOSIS — Z95810 Presence of automatic (implantable) cardiac defibrillator: Secondary | ICD-10-CM

## 2018-02-18 ENCOUNTER — Encounter: Admit: 2018-02-18 | Discharge: 2018-02-18 | Payer: MEDICARE

## 2018-02-18 MED ORDER — HYDROCHLOROTHIAZIDE 25 MG PO TAB
25 mg | ORAL_TABLET | Freq: Every morning | ORAL | 2 refills | 28.00000 days | Status: AC
Start: 2018-02-18 — End: 2018-11-15

## 2018-05-05 ENCOUNTER — Ambulatory Visit: Admit: 2018-05-05 | Discharge: 2018-05-05 | Payer: MEDICARE

## 2018-05-05 ENCOUNTER — Encounter: Admit: 2018-05-05 | Discharge: 2018-05-05 | Payer: MEDICARE

## 2018-05-05 DIAGNOSIS — I255 Ischemic cardiomyopathy: Principal | ICD-10-CM

## 2018-05-05 DIAGNOSIS — Z95810 Presence of automatic (implantable) cardiac defibrillator: ICD-10-CM

## 2018-05-10 ENCOUNTER — Encounter: Admit: 2018-05-10 | Discharge: 2018-05-10 | Payer: MEDICARE

## 2018-08-04 DIAGNOSIS — I255 Ischemic cardiomyopathy: Principal | ICD-10-CM

## 2018-08-05 ENCOUNTER — Ambulatory Visit: Admit: 2018-08-05 | Discharge: 2018-08-05

## 2018-08-05 DIAGNOSIS — Z95810 Presence of automatic (implantable) cardiac defibrillator: Secondary | ICD-10-CM

## 2018-08-09 ENCOUNTER — Encounter: Admit: 2018-08-09 | Discharge: 2018-08-09

## 2018-08-09 NOTE — Telephone Encounter
Patient called to verify his appointments on Friday 08/13/18

## 2018-08-13 ENCOUNTER — Ambulatory Visit: Admit: 2018-08-13 | Discharge: 2018-08-14

## 2018-08-13 ENCOUNTER — Encounter: Admit: 2018-08-13 | Discharge: 2018-08-13

## 2018-08-13 ENCOUNTER — Ambulatory Visit: Admit: 2018-08-13 | Discharge: 2018-08-13

## 2018-08-13 DIAGNOSIS — I447 Left bundle-branch block, unspecified: Secondary | ICD-10-CM

## 2018-08-13 DIAGNOSIS — I2510 Atherosclerotic heart disease of native coronary artery without angina pectoris: Secondary | ICD-10-CM

## 2018-08-13 DIAGNOSIS — I255 Ischemic cardiomyopathy: Secondary | ICD-10-CM

## 2018-08-13 DIAGNOSIS — E785 Hyperlipidemia, unspecified: Secondary | ICD-10-CM

## 2018-08-13 DIAGNOSIS — Z95810 Presence of automatic (implantable) cardiac defibrillator: Secondary | ICD-10-CM

## 2018-08-13 DIAGNOSIS — I10 Essential (primary) hypertension: Secondary | ICD-10-CM

## 2018-08-13 DIAGNOSIS — I480 Paroxysmal atrial fibrillation: Secondary | ICD-10-CM

## 2018-08-13 DIAGNOSIS — I5022 Chronic systolic (congestive) heart failure: Secondary | ICD-10-CM

## 2018-08-13 DIAGNOSIS — E7800 Pure hypercholesterolemia, unspecified: Secondary | ICD-10-CM

## 2018-08-13 DIAGNOSIS — Z905 Acquired absence of kidney: Secondary | ICD-10-CM

## 2018-08-13 DIAGNOSIS — Z8679 Personal history of other diseases of the circulatory system: Secondary | ICD-10-CM

## 2018-08-13 DIAGNOSIS — Z7901 Long term (current) use of anticoagulants: Secondary | ICD-10-CM

## 2018-08-13 MED ORDER — PERFLUTREN LIPID MICROSPHERES 1.1 MG/ML IV SUSP
1-20 mL | Freq: Once | INTRAVENOUS | 0 refills | Status: CP | PRN
Start: 2018-08-13 — End: ?
  Administered 2018-08-13: 19:00:00 1.5 mL via INTRAVENOUS

## 2018-08-13 NOTE — Telephone Encounter
Patient called to ask if he needs to fast prior to his appointments today.  He does not need to fast, he is having a device check and a resting echo prior to his office visit with Dr. Lawana Pai today.

## 2018-11-03 ENCOUNTER — Encounter: Admit: 2018-11-03 | Discharge: 2018-11-03 | Payer: MEDICARE

## 2018-11-03 ENCOUNTER — Ambulatory Visit: Admit: 2018-11-03 | Discharge: 2018-11-03 | Payer: MEDICARE

## 2018-11-03 DIAGNOSIS — Z95810 Presence of automatic (implantable) cardiac defibrillator: Secondary | ICD-10-CM

## 2018-11-15 ENCOUNTER — Encounter: Admit: 2018-11-15 | Discharge: 2018-11-15 | Payer: MEDICARE

## 2018-11-15 MED ORDER — HYDROCHLOROTHIAZIDE 25 MG PO TAB
25 mg | ORAL_TABLET | Freq: Every morning | ORAL | 2 refills | 28.00000 days | Status: DC
Start: 2018-11-15 — End: 2019-08-15

## 2018-12-28 ENCOUNTER — Encounter: Admit: 2018-12-28 | Discharge: 2018-12-28 | Payer: MEDICARE

## 2018-12-28 MED ORDER — NITROGLYCERIN 0.4 MG SL SUBL
.4 mg | ORAL_TABLET | SUBLINGUAL | 0 refills | 9.00000 days | Status: AC | PRN
Start: 2018-12-28 — End: ?

## 2019-03-14 ENCOUNTER — Encounter: Admit: 2019-03-14 | Discharge: 2019-03-14 | Payer: MEDICARE

## 2019-03-14 MED ORDER — LOSARTAN 50 MG PO TAB
50 mg | ORAL_TABLET | Freq: Every day | ORAL | 0 refills | 30.00000 days | Status: DC
Start: 2019-03-14 — End: 2019-06-15

## 2019-05-05 ENCOUNTER — Encounter: Admit: 2019-05-05 | Discharge: 2019-05-05 | Payer: MEDICARE

## 2019-05-05 DIAGNOSIS — Z95810 Presence of automatic (implantable) cardiac defibrillator: Secondary | ICD-10-CM

## 2019-05-27 ENCOUNTER — Encounter: Admit: 2019-05-27 | Discharge: 2019-05-27 | Payer: MEDICARE

## 2019-05-27 ENCOUNTER — Ambulatory Visit: Admit: 2019-05-27 | Discharge: 2019-05-27 | Payer: MEDICARE

## 2019-05-27 DIAGNOSIS — I255 Ischemic cardiomyopathy: Secondary | ICD-10-CM

## 2019-05-27 DIAGNOSIS — E785 Hyperlipidemia, unspecified: Secondary | ICD-10-CM

## 2019-05-27 DIAGNOSIS — I10 Essential (primary) hypertension: Secondary | ICD-10-CM

## 2019-05-27 DIAGNOSIS — Z8679 Personal history of other diseases of the circulatory system: Secondary | ICD-10-CM

## 2019-05-27 DIAGNOSIS — I2510 Atherosclerotic heart disease of native coronary artery without angina pectoris: Secondary | ICD-10-CM

## 2019-05-27 DIAGNOSIS — Z905 Acquired absence of kidney: Secondary | ICD-10-CM

## 2019-05-27 DIAGNOSIS — Z7901 Long term (current) use of anticoagulants: Secondary | ICD-10-CM

## 2019-05-27 DIAGNOSIS — E7800 Pure hypercholesterolemia, unspecified: Secondary | ICD-10-CM

## 2019-05-27 DIAGNOSIS — I447 Left bundle-branch block, unspecified: Secondary | ICD-10-CM

## 2019-05-27 MED ORDER — ATORVASTATIN 40 MG PO TAB
40 mg | ORAL_TABLET | Freq: Every day | ORAL | 3 refills | Status: AC
Start: 2019-05-27 — End: ?

## 2019-06-15 ENCOUNTER — Encounter: Admit: 2019-06-15 | Discharge: 2019-06-15 | Payer: MEDICARE

## 2019-06-15 MED ORDER — LOSARTAN 50 MG PO TAB
50 mg | ORAL_TABLET | Freq: Every day | ORAL | 3 refills | 30.00000 days | Status: AC
Start: 2019-06-15 — End: ?

## 2019-08-03 ENCOUNTER — Encounter: Admit: 2019-08-03 | Discharge: 2019-08-03 | Payer: MEDICARE

## 2019-08-15 ENCOUNTER — Encounter: Admit: 2019-08-15 | Discharge: 2019-08-15 | Payer: MEDICARE

## 2019-08-15 MED ORDER — HYDROCHLOROTHIAZIDE 25 MG PO TAB
25 mg | ORAL_TABLET | Freq: Every morning | ORAL | 2 refills | 28.00000 days | Status: AC
Start: 2019-08-15 — End: ?

## 2019-10-26 ENCOUNTER — Encounter: Admit: 2019-10-26 | Discharge: 2019-10-26 | Payer: MEDICARE

## 2019-10-30 ENCOUNTER — Encounter: Admit: 2019-10-30 | Discharge: 2019-10-30 | Payer: MEDICARE

## 2019-10-31 ENCOUNTER — Encounter: Admit: 2019-10-31 | Discharge: 2019-10-31 | Payer: MEDICARE

## 2019-10-31 ENCOUNTER — Inpatient Hospital Stay: Admit: 2019-10-31 | Payer: MEDICARE

## 2019-10-31 ENCOUNTER — Inpatient Hospital Stay: Admit: 2019-10-31 | Discharge: 2019-10-31 | Payer: MEDICARE

## 2019-10-31 DIAGNOSIS — Z905 Acquired absence of kidney: Secondary | ICD-10-CM

## 2019-10-31 DIAGNOSIS — Z8679 Personal history of other diseases of the circulatory system: Secondary | ICD-10-CM

## 2019-10-31 DIAGNOSIS — I10 Essential (primary) hypertension: Secondary | ICD-10-CM

## 2019-10-31 DIAGNOSIS — I2510 Atherosclerotic heart disease of native coronary artery without angina pectoris: Secondary | ICD-10-CM

## 2019-10-31 DIAGNOSIS — E785 Hyperlipidemia, unspecified: Secondary | ICD-10-CM

## 2019-10-31 DIAGNOSIS — Z7901 Long term (current) use of anticoagulants: Secondary | ICD-10-CM

## 2019-10-31 DIAGNOSIS — I447 Left bundle-branch block, unspecified: Secondary | ICD-10-CM

## 2019-10-31 MED ORDER — PHENYLEPHRINE HCL IN 0.9% NACL 1 MG/10 ML (100 MCG/ML) IV SYRG
INTRAVENOUS | 0 refills | Status: DC
Start: 2019-10-31 — End: 2019-11-01
  Administered 2019-11-01 (×3): 100 ug via INTRAVENOUS

## 2019-10-31 MED ORDER — FENTANYL CITRATE (PF) 50 MCG/ML IJ SOLN
INTRAVENOUS | 0 refills | Status: DC
Start: 2019-10-31 — End: 2019-11-01
  Administered 2019-11-01 (×2): 25 ug via INTRAVENOUS

## 2019-10-31 MED ORDER — LIDOCAINE (PF) 20 MG/ML (2 %) IJ SOLN
INTRAVENOUS | 0 refills | Status: DC
Start: 2019-10-31 — End: 2019-11-01
  Administered 2019-11-01: 80 mg via INTRAVENOUS

## 2019-10-31 MED ORDER — DEXAMETHASONE SODIUM PHOSPHATE 4 MG/ML IJ SOLN
INTRAVENOUS | 0 refills | Status: DC
Start: 2019-10-31 — End: 2019-11-01
  Administered 2019-11-01: 01:00:00 4 mg via INTRAVENOUS

## 2019-10-31 MED ORDER — PROPOFOL INJ 10 MG/ML IV VIAL
INTRAVENOUS | 0 refills | Status: DC
Start: 2019-10-31 — End: 2019-11-01
  Administered 2019-11-01: 100 mg via INTRAVENOUS

## 2019-10-31 MED ORDER — ONDANSETRON HCL (PF) 4 MG/2 ML IJ SOLN
INTRAVENOUS | 0 refills | Status: DC
Start: 2019-10-31 — End: 2019-11-01
  Administered 2019-11-01: 01:00:00 4 mg via INTRAVENOUS

## 2019-10-31 MED ORDER — SUCCINYLCHOLINE CHLORIDE 20 MG/ML IJ SOLN
INTRAVENOUS | 0 refills | Status: DC
Start: 2019-10-31 — End: 2019-11-01
  Administered 2019-11-01: 100 mg via INTRAVENOUS

## 2019-10-31 MED ADMIN — LACTATED RINGERS IV SOLP [4318]: 500 mL | INTRAVENOUS | @ 10:00:00 | Stop: 2019-10-31 | NDC 00338011704

## 2019-10-31 MED ADMIN — ASPIRIN 81 MG PO TBEC [14113]: 81 mg | ORAL | @ 14:00:00 | NDC 00536123441

## 2019-10-31 MED ADMIN — DIPHENHYDRAMINE HCL 50 MG/ML IJ SOLN [2508]: 25 mg | INTRAVENOUS | @ 21:00:00 | Stop: 2019-10-31 | NDC 63323066400

## 2019-10-31 MED ADMIN — TAMSULOSIN 0.4 MG PO CAP [80077]: 0.4 mg | ORAL | @ 14:00:00 | NDC 00904640161

## 2019-10-31 MED ADMIN — PIPERACILLIN-TAZOBACTAM 3.375 GRAM IV SOLR [80250]: 3.375 g | INTRAVENOUS | @ 15:00:00 | NDC 60505615700

## 2019-10-31 MED ADMIN — DIPHENHYDRAMINE HCL 50 MG/ML IJ SOLN [2508]: 25 mg | INTRAVENOUS | @ 23:00:00 | Stop: 2019-10-31 | NDC 63323066400

## 2019-10-31 MED ADMIN — SODIUM CHLORIDE 0.9 % IV PGBK (MB+) [95161]: 3.375 g | INTRAVENOUS | @ 15:00:00 | NDC 00338915930

## 2019-10-31 MED ADMIN — PIPERACILLIN-TAZOBACTAM 3.375 GRAM IV SOLR [80250]: 3.375 g | INTRAVENOUS | @ 09:00:00 | NDC 60505615700

## 2019-10-31 MED ADMIN — LACTATED RINGERS IV SOLP [4318]: 1000.000 mL | INTRAVENOUS | Stop: 2019-11-02 | NDC 00338011704

## 2019-10-31 MED ADMIN — METOPROLOL SUCCINATE 50 MG PO TB24 [77931]: 200 mg | ORAL | @ 14:00:00 | NDC 00904632361

## 2019-10-31 MED ADMIN — SODIUM CHLORIDE 0.9 % IV PGBK (MB+) [95161]: 3.375 g | INTRAVENOUS | @ 09:00:00 | NDC 00338915930

## 2019-10-31 MED ADMIN — DIPHENHYDRAMINE HCL 50 MG/ML IJ SOLN [2508]: 25 mg | INTRAVENOUS | @ 20:00:00 | Stop: 2019-10-31 | NDC 63323066400

## 2019-10-31 NOTE — Progress Notes
79 yo M, presented to outside ED with 3 day complaint of n/v and mild epigastic pain.  Also states that he has not been able to keep any food down.  CT abdomen without contrast shows that the gall bladder is hard to visualize but that his CBD is 1.4 cm dilated and recommended further GI workup for evaluation for a possible ERCP.  Their hospital does not have Gi.  Patient is alert and oriented x 4.  Good historian.  PMH significant for HLD, HTN, Atrial Fib (pt on Coumadin) and a pacemaker.  Covid swab done there today and came back negative.    VS  BP- 100/55, HR- 71, SpO2- 94% on RA, RR- 20, T- 97.9    LABS  WBC- 6.6, Hgb- 12.4, Plt - 132  INR- pending  Electrolytes in CMP all WNL, of note creatine is 2.3  AST- 120, ALT- 294, Alk Phos- 499, T Bili- 4.6  UA essentially clean    MEDS  500 ml NS bolus  Zofran 8 mg

## 2019-10-31 NOTE — Telephone Encounter
Patient currently admitted

## 2019-10-31 NOTE — Progress Notes
Spoke with [place], RN in endoscopy.  Asked if she could send CLE for AF burden  She states she will after procedure

## 2019-11-01 ENCOUNTER — Inpatient Hospital Stay: Admit: 2019-11-01 | Discharge: 2019-11-01 | Payer: MEDICARE

## 2019-11-01 ENCOUNTER — Encounter: Admit: 2019-11-01 | Discharge: 2019-11-01 | Payer: MEDICARE

## 2019-11-01 DIAGNOSIS — I447 Left bundle-branch block, unspecified: Secondary | ICD-10-CM

## 2019-11-01 DIAGNOSIS — T8092XA Unspecified transfusion reaction, initial encounter: Secondary | ICD-10-CM

## 2019-11-01 DIAGNOSIS — E785 Hyperlipidemia, unspecified: Secondary | ICD-10-CM

## 2019-11-01 DIAGNOSIS — Z905 Acquired absence of kidney: Secondary | ICD-10-CM

## 2019-11-01 DIAGNOSIS — Z8679 Personal history of other diseases of the circulatory system: Secondary | ICD-10-CM

## 2019-11-01 DIAGNOSIS — I2510 Atherosclerotic heart disease of native coronary artery without angina pectoris: Secondary | ICD-10-CM

## 2019-11-01 DIAGNOSIS — I10 Essential (primary) hypertension: Secondary | ICD-10-CM

## 2019-11-01 DIAGNOSIS — Z7901 Long term (current) use of anticoagulants: Secondary | ICD-10-CM

## 2019-11-01 MED ADMIN — TAMSULOSIN 0.4 MG PO CAP [80077]: 0.4 mg | ORAL | @ 14:00:00 | NDC 00904640161

## 2019-11-01 MED ADMIN — PIPERACILLIN-TAZOBACTAM 3.375 GRAM IV SOLR [80250]: 3.375 g | INTRAVENOUS | @ 03:00:00 | NDC 60505615700

## 2019-11-01 MED ADMIN — SODIUM CHLORIDE 0.9 % IR SOLN [11403]: 1000 mL | @ 01:00:00 | Stop: 2019-11-01 | NDC 00338004804

## 2019-11-01 MED ADMIN — PIPERACILLIN-TAZOBACTAM 3.375 GRAM IV SOLR [80250]: 3.375 g | INTRAVENOUS | @ 21:00:00 | NDC 60505615700

## 2019-11-01 MED ADMIN — SODIUM CHLORIDE 0.9 % IV PGBK (MB+) [95161]: 3.375 g | INTRAVENOUS | @ 21:00:00 | NDC 00338915930

## 2019-11-01 MED ADMIN — SODIUM CHLORIDE 0.9 % IV PGBK (MB+) [95161]: 3.375 g | INTRAVENOUS | @ 03:00:00 | NDC 00338915930

## 2019-11-01 MED ADMIN — PIPERACILLIN-TAZOBACTAM 3.375 GRAM IV SOLR [80250]: 3.375 g | INTRAVENOUS | @ 10:00:00 | NDC 60505615700

## 2019-11-01 MED ADMIN — SODIUM CHLORIDE 0.9 % IV PGBK (MB+) [95161]: 3.375 g | INTRAVENOUS | @ 10:00:00 | NDC 00338915930

## 2019-11-01 MED ADMIN — METOPROLOL SUCCINATE 50 MG PO TB24 [77931]: 200 mg | ORAL | @ 14:00:00 | NDC 00904632361

## 2019-11-01 MED ADMIN — ASPIRIN 81 MG PO TBEC [14113]: 81 mg | ORAL | @ 14:00:00 | NDC 00904678370

## 2019-11-01 MED ADMIN — PIPERACILLIN-TAZOBACTAM 3.375 GRAM IV SOLR [80250]: 3.375 g | INTRAVENOUS | @ 14:00:00 | NDC 60505615700

## 2019-11-01 MED ADMIN — IOHEXOL 300 MG IODINE/ML IV SOLN [79156]: 20 mL | INTRAMUSCULAR | @ 01:00:00 | Stop: 2019-11-01 | NDC 00407141361

## 2019-11-01 MED ADMIN — SODIUM CHLORIDE 0.9 % IV PGBK (MB+) [95161]: 3.375 g | INTRAVENOUS | @ 14:00:00 | NDC 00338915930

## 2019-11-02 ENCOUNTER — Encounter: Admit: 2019-11-02 | Discharge: 2019-11-02 | Payer: MEDICARE

## 2019-11-02 MED ADMIN — PIPERACILLIN-TAZOBACTAM 3.375 GRAM IV SOLR [80250]: 3.375 g | INTRAVENOUS | @ 03:00:00 | NDC 60505615700

## 2019-11-02 MED ADMIN — SODIUM CHLORIDE 0.9 % IV PGBK (MB+) [95161]: 3.375 g | INTRAVENOUS | @ 10:00:00 | Stop: 2019-11-02 | NDC 00338915930

## 2019-11-02 MED ADMIN — METOPROLOL SUCCINATE 50 MG PO TB24 [77931]: 200 mg | ORAL | @ 14:00:00 | Stop: 2019-11-02 | NDC 00904632361

## 2019-11-02 MED ADMIN — ACETAMINOPHEN 325 MG PO TAB [101]: 650 mg | ORAL | @ 04:00:00 | NDC 00904677361

## 2019-11-02 MED ADMIN — SODIUM CHLORIDE 0.9 % IV PGBK (MB+) [95161]: 3.375 g | INTRAVENOUS | @ 03:00:00 | NDC 00338915930

## 2019-11-02 MED ADMIN — TAMSULOSIN 0.4 MG PO CAP [80077]: 0.4 mg | ORAL | @ 14:00:00 | Stop: 2019-11-02 | NDC 00904640161

## 2019-11-02 MED ADMIN — ASPIRIN 81 MG PO TBEC [14113]: 81 mg | ORAL | @ 14:00:00 | Stop: 2019-11-02 | NDC 00904678370

## 2019-11-02 MED ADMIN — PIPERACILLIN-TAZOBACTAM 3.375 GRAM IV SOLR [80250]: 3.375 g | INTRAVENOUS | @ 10:00:00 | Stop: 2019-11-02 | NDC 60505615700

## 2019-11-03 ENCOUNTER — Encounter: Admit: 2019-11-03 | Discharge: 2019-11-03 | Payer: MEDICARE

## 2019-11-03 MED ORDER — METOPROLOL SUCCINATE 200 MG PO TB24
200 mg | ORAL_TABLET | Freq: Every day | ORAL | 3 refills | 90.00000 days | Status: AC
Start: 2019-11-03 — End: ?

## 2019-11-05 ENCOUNTER — Encounter: Admit: 2019-11-05 | Discharge: 2019-11-05 | Payer: MEDICARE

## 2019-11-05 ENCOUNTER — Inpatient Hospital Stay: Admit: 2019-11-05 | Payer: MEDICARE

## 2019-11-05 DIAGNOSIS — K921 Melena: Secondary | ICD-10-CM

## 2019-11-05 LAB — COMPREHENSIVE METABOLIC PANEL
Lab: 0.7 mg/dL (ref 0.3–1.2)
Lab: 1.4 mg/dL — ABNORMAL HIGH (ref 0.4–1.24)
Lab: 135 mg/dL — ABNORMAL HIGH (ref 70–100)
Lab: 136 MMOL/L — ABNORMAL LOW (ref 137–147)
Lab: 174 U/L — ABNORMAL HIGH (ref 25–110)
Lab: 20 U/L (ref 7–40)
Lab: 22 MMOL/L (ref 21–30)
Lab: 3.6 g/dL (ref 3.5–5.0)
Lab: 31 mg/dL — ABNORMAL HIGH (ref 7–25)
Lab: 40 U/L (ref 7–56)
Lab: 48 mL/min — ABNORMAL LOW (ref >60)
Lab: 58 mL/min — ABNORMAL LOW (ref >60)
Lab: 6.6 g/dL (ref 6.0–8.0)
Lab: 8.5 mg/dL (ref 8.5–10.6)
Lab: 10 (ref 3–12)

## 2019-11-05 LAB — CBC: Lab: 5.9 10*3/uL (ref 4.5–11.0)

## 2019-11-05 LAB — LACTIC ACID(LACTATE): Lab: 1.8 MMOL/L — ABNORMAL LOW (ref 0.5–2.0)

## 2019-11-05 LAB — TROPONIN-I: Lab: 0 ng/mL (ref 0.0–0.05)

## 2019-11-05 LAB — PROTIME INR (PT): Lab: 1.1 M/UL — ABNORMAL LOW (ref 0.8–1.2)

## 2019-11-05 MED ORDER — FINASTERIDE 5 MG PO TAB
5 mg | Freq: Every day | ORAL | 0 refills | Status: AC
Start: 2019-11-05 — End: ?
  Administered 2019-11-05 – 2019-11-07 (×3): 5 mg via ORAL

## 2019-11-05 MED ORDER — TAMSULOSIN 0.4 MG PO CAP
.4 mg | Freq: Every day | ORAL | 0 refills | Status: AC
Start: 2019-11-05 — End: ?
  Administered 2019-11-06 – 2019-11-07 (×2): 0.4 mg via ORAL

## 2019-11-05 MED ORDER — ACETAMINOPHEN 325 MG PO TAB
650 mg | ORAL | 0 refills | Status: AC | PRN
Start: 2019-11-05 — End: ?

## 2019-11-05 MED ORDER — ATORVASTATIN 40 MG PO TAB
40 mg | Freq: Every day | ORAL | 0 refills | Status: AC
Start: 2019-11-05 — End: ?
  Administered 2019-11-05 – 2019-11-07 (×3): 40 mg via ORAL

## 2019-11-05 MED ORDER — DOCUSATE SODIUM 100 MG PO CAP
100 mg | Freq: Every day | ORAL | 0 refills | Status: AC | PRN
Start: 2019-11-05 — End: ?

## 2019-11-05 MED ORDER — MELATONIN 3 MG PO TAB
3 mg | Freq: Every evening | ORAL | 0 refills | Status: AC | PRN
Start: 2019-11-05 — End: ?

## 2019-11-05 MED ORDER — AMOXICILLIN-POT CLAVULANATE 875-125 MG PO TAB
875 mg | Freq: Two times a day (BID) | ORAL | 0 refills | Status: AC
Start: 2019-11-05 — End: ?
  Administered 2019-11-05: 875 mg via ORAL

## 2019-11-05 MED ORDER — PANTOPRAZOLE 40 MG IV SOLR
40 mg | Freq: Two times a day (BID) | INTRAVENOUS | 0 refills | Status: AC
Start: 2019-11-05 — End: ?
  Administered 2019-11-06 – 2019-11-07 (×4): 40 mg via INTRAVENOUS

## 2019-11-05 MED ORDER — ONDANSETRON HCL (PF) 4 MG/2 ML IJ SOLN
4 mg | INTRAVENOUS | 0 refills | Status: AC | PRN
Start: 2019-11-05 — End: ?

## 2019-11-05 NOTE — H&P (View-Only)
Admission History and Physical Note      Name:  Justin Farmer                       MRN:  [id number]                Admission Date: (Not on file)                     Primary Care Physician: Lynne Leader      Chief Complaint:    Melena, dizziness    Assessment/Plan:      Justin Farmer is a 79 y.o. 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     1.  Suspected GI bleed  -Reported dark stools around 3-4 every day.  Currently on warfarin for anticoagulation.  -We will hold anticoagulation for now due to concern of GI bleed.  We discussed in detail about holding anticoagulation with risk of stroke in setting of A. fib.  There is risk of bleeding if we continue him on warfarin.  -Baseline hemoglobin is around 11.  His hemoglobin trends at outside hospital 9.1>8.4  -We will keep him on clear liquids for now  -Continue Protonix IV twice daily.  -GI consult  -We will monitor his hemoglobin closely and any signs of bleeding    2.  Recent admission for acute cholangitis.  -ERCP on 10/11 showed CBD dilated to 10 mm with sludge and a large stone in the distal CBD just above the ampulla  -Currently on Augmentin.    3.  Chronic kidney disease  -Baseline creatinine seems around 1.5  -We will avoid renal toxic agent  ?  4. Ischemic cardiomyopathy  CAD  -S/p BiV PPM, was following with Dr. Benedict Needy at [facility], last seen 05/27/19  -S/p LAD stent in 2007 and 2013  -Last echo 07/2018: LVEF 55%, grade 1 LV diastolic dysfunction  -Holding aspirin due to GI bleed.  -Holding metoprolol, hydrochlorothiazide, losartan for now due to concern of GI bleed    5.  Paroxysmal A. fib.  On anticoagulation  -CHADS2VASC score 5 (age x2, CHF, HTN, hx of vascular disease)  -Received vitamin K at outside hospital.  Current INR is 1.  -We discussed in detail about risk versus benefit of being on anticoagulation at this point.  We will hold warfarin due to concern of GI bleed.    6. BPH  -Continue PTA tamsulosin 0.4mg  daily    Diet: Clear liquid  DVT PPx: Holding prophylaxis due to GI bleed  Code Status: Full code    Jolee Ewing MD  Internal Medicine    If any questions or concerns, Please contact Med Private Night    Note: For any questions/concerns; please page the Team Pager.        History of Present Illness:     Justin Farmer is a 79 y.o. 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  He reported having dark stools around 3-4 every day since 10/13.  He has 2 dark bowel movements today.  He denies taking any NSAIDs, no abdominal pain, focal neurologic deficit, urinary symptoms.  He endorses dizziness especially on standing up.  At outside hospital,  he was hemodynamically stable.  Orthostatics were checked with systolic dropping from 1 32-80.  Labs consistent with sodium 133, potassium 4, BUN 35, creatinine 1.68, T bili 0.8, alkaline phosphatase 206, AST 22, ALP 67, INR 1, FOBT positive, hemoglobin 9.1-8.4.  Received 1 L normal saline bolus, vitamin K.    At arrival, is very pleasant person.  Denies any complaints.  He lives at home by himself.    Review of Systems:    Review of Systems   Constitutional: Positive for malaise/fatigue. Negative for chills, fever and weight loss.   HENT: Negative for ear pain, hearing loss and tinnitus.    Respiratory: Negative for shortness of breath.    Cardiovascular: Negative for chest pain, palpitations and orthopnea.   Gastrointestinal: Positive for diarrhea and melena. Negative for abdominal pain, blood in stool, constipation, nausea and vomiting.   Genitourinary: Negative for flank pain, frequency and hematuria.   Musculoskeletal: Negative for back pain and neck pain.   Neurological: Positive for dizziness. Negative for tingling, tremors, sensory change and headaches.   Psychiatric/Behavioral: Negative for depression, hallucinations and suicidal ideas.       Medical History:   Diagnosis Date   ? CAD (coronary artery disease) 06/07/2008   ? Chronic anticoagulation, on Pradaxa 07/31/2011   ? History of atrial fibrillation 02/22/2011    01/2011: TEE: EF 40-45%. Moderate left atrial enlargement. Mild right atrial enlargement. Mild MR/TR. No thrombus is noted in the left atrial appendage or the left atrium.  02/21/11: Cardioversion: 200 J biphasic    ? 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 HLD (hyperlipidemia) 06/07/2008   ? HTN (hypertension) 06/07/2008   ? LBBB (left bundle branch block) 06/05/2009    05/2008: rate related during stress test and noted again on 06/01/09 at rate of 54 bpm    ? Transfusion reaction 11/01/2019     Allergic Transfusion Reaction  NAME:Justin Farmer             MRN: [id number]             DOB:[birth year]          AGE: 79 y.o. ADMISSION DATE: 10/30/2019             DAYS ADMITTED: LOS: 1 day  Justin Farmer is 79 y.o. male with history of INR 3.1 with obstructive jaundice secondary to choledocholithiasis on warfarin for A Fib who received 223 mL of plasma with no premedication.         Surgical History:   Procedure Laterality Date   ? Insert CRT-D and Leads Left 01/02/2015    Performed by Kathreen Cornfield, MD at [facility] EP LAB   ? Fluoroscopy N/A 01/02/2015    Performed by Kathreen Cornfield, MD at [facility] EP LAB   ? Insert Lead Left Ventricle at time of Generator Implant Left 01/02/2015    Performed by Kathreen Cornfield, MD at [facility] EP LAB   ? ESOPHAGOGASTRODUODENOSCOPY WITH ENDOSCOPIC ULTRASOUND EXAMINATION - FLEXIBLE N/A 10/31/2019    Performed by Comer Locket, MD at [facility] ENDO   ? ENDOSCOPIC RETROGRADE CHOLANGIOPANCREATOGRAPHY WITH REMOVAL CALCULI/ DEBRIS FROM BILIARY/ PANCREATIC DUCT N/A 10/31/2019    Performed by Comer Locket, MD at [facility] ENDO       No family history on file.    Social History     Socioeconomic History   ? Marital status: Married     Spouse name: Not on file   ? Number of children: Not  on file   ? Years of education: Not on file   ? Highest education level: Not on file   Occupational History   ? Not on file   Tobacco Use   ? Smoking status: Never Smoker   ? Smokeless tobacco: Never Used   Vaping Use   ? Vaping Use: Never used   Substance and Sexual Activity   ? Alcohol use: No   ? Drug use: No   ? Sexual activity: Not on file   Other Topics Concern   ? Not on file   Social History Narrative   ? Not on file     Social Determinants of Health     Financial Resource Strain:    ? Difficulty of Paying Living Expenses:    Food Insecurity:    ? Worried About Programme researcher, broadcasting/film/video in the Last Year:    ? Barista in the Last Year:    Transportation Needs:    ? Freight forwarder (Medical):    ? Lack of Transportation (Non-Medical):    Physical Activity:    ? Days of Exercise per Week:    ? Minutes of Exercise per Session:    Stress:    ? Feeling of Stress :    Social Connections:    ? Frequency of Communication with Friends and Family:    ? Frequency of Social Gatherings with Friends and Family:    ? Attends Religious Services:    ? Active Member of Clubs or Organizations:    ? Attends Banker Meetings:    ? Marital Status:    Intimate Partner Violence:    ? Fear of Current or Ex-Partner:    ? Emotionally Abused:    ? Physically Abused:    ? Sexually Abused:         All the histories listed above; including Past Medical History, Past Surgical History, Family History and Social History have been reviewed.    Immunizations (includes history and patient reported):   There is no immunization history on file for this patient.        Allergies:  Morphine    Medications:  Medications Prior to Admission   Medication Sig   ? amoxicillin-potassium clavulanate (AUGMENTIN) 875/125 mg tablet Take one tablet by mouth every 12 hours for 4 days. Take with food.   ? aspirin EC 81 mg tablet Take 1 Tab by mouth daily.   ? atorvastatin (LIPITOR) 40 mg tablet Take one tablet by mouth daily.   ? colestipoL (COLESTID) 1 gram tablet Take 1 g by mouth daily.   ? finasteride (PROSCAR) 5 mg tablet Take 5 mg by mouth daily.   ? hydroCHLOROthiazide (HYDRODIURIL) 25 mg tablet TAKE ONE TABLET BY MOUTH EVERY MORNING.   ? HYDROcodone/acetaminophen (NORCO) 5/325 mg tablet Take 1-2 Tabs by mouth every 4 hours as needed for Pain (Patient taking differently: Take 1-2 tablets by mouth every 4 hours as needed for Pain  PRN USE (medication a few years old and still uses if needed but does not like how it makes him feel))   ? losartan (COZAAR) 50 mg tablet TAKE ONE TABLET BY MOUTH DAILY (Patient taking differently: Take 50 mg by mouth at bedtime daily.)   ? metoprolol XL (TOPROL XL) 200 mg extended release tablet Take one tablet by mouth daily.   ? nitroglycerin (NITROSTAT) 0.4 mg tablet Place one tablet under tongue every 5 minutes as needed for Chest Pain.   ?  omeprazole DR (PRILOSEC) 20 mg capsule Take 20 mg by mouth daily before breakfast.   ? tamsulosin (FLOMAX) 0.4 mg capsule Take 1 Cap by mouth Daily.   ? VIT C/VIT E ACETATE/LUTEIN/MIN (OCUVITE LUTEIN PO) Take 1 Tab by mouth daily.   ? warfarin (COUMADIN) 5 mg tablet Take 1 Tab by mouth at bedtime daily. as directed based on INR.       Physical Exam:  Vital Signs: Last Filed In 24 Hours Vital Signs: 24 Hour Range                  Physical Exam  Vitals and nursing note reviewed.   HENT:      Head: Normocephalic.      Mouth/Throat:      Mouth: Mucous membranes are moist.   Eyes:      Pupils: Pupils are equal, round, and reactive to light.   Cardiovascular:      Rate and Rhythm: Normal rate and regular rhythm.      Pulses: Normal pulses.   Pulmonary:      Effort: Pulmonary effort is normal. No respiratory distress.   Abdominal:      General: Abdomen is flat. Bowel sounds are normal. There is no distension.      Tenderness: There is no abdominal tenderness. Musculoskeletal:         General: No swelling or deformity. Normal range of motion.      Cervical back: Normal range of motion and neck supple.   Skin:     General: Skin is warm.      Capillary Refill: Capillary refill takes less than 2 seconds.      Coloration: Skin is not jaundiced.   Neurological:      General: No focal deficit present.      Mental Status: He is alert and oriented to person, place, and time.      Cranial Nerves: No cranial nerve deficit.           Lab/Radiology/Other Diagnostic Tests:  24-hour labs:    Results for orders placed or performed during the hospital encounter of 11/05/19 (from the past 24 hour(s))   CBC    Collection Time: 11/05/19  5:33 PM   Result Value Ref Range    White Blood Cells 5.9 4.5 - 11.0 K/UL    RBC 2.95 (L) 4.4 - 5.5 M/UL    Hemoglobin 9.2 (L) 13.5 - 16.5 GM/DL    Hematocrit 16.1 (L) 40 - 50 %    MCV 91.5 80 - 100 FL    MCH 31.3 26 - 34 PG    MCHC 34.2 32.0 - 36.0 G/DL    RDW 09.6 11 - 15 %    Platelet Count 222 150 - 400 K/UL    MPV 7.6 7 - 11 FL   PROTIME INR (PT)    Collection Time: 11/05/19  5:33 PM   Result Value Ref Range    INR 1.1 0.8 - 1.2   COMPREHENSIVE METABOLIC PANEL    Collection Time: 11/05/19  5:33 PM   Result Value Ref Range    Sodium 136 (L) 137 - 147 MMOL/L    Potassium 3.7 3.5 - 5.1 MMOL/L    Chloride 104 98 - 110 MMOL/L    Glucose 135 (H) 70 - 100 MG/DL    Blood Urea Nitrogen 31 (H) 7 - 25 MG/DL    Creatinine 0.45 (H) 0.4 - 1.24 MG/DL    Calcium 8.5 8.5 - 40.9 MG/DL  Total Protein 6.6 6.0 - 8.0 G/DL    Total Bilirubin 0.7 0.3 - 1.2 MG/DL    Albumin 3.6 3.5 - 5.0 G/DL    Alk Phosphatase 454 (H) 25 - 110 U/L    AST (SGOT) 20 7 - 40 U/L    CO2 22 21 - 30 MMOL/L    ALT (SGPT) 40 7 - 56 U/L    Anion Gap 10 3 - 12    eGFR Non African American 48 (L) >60 mL/min    eGFR African American 58 (L) >60 mL/min   LACTIC ACID(LACTATE)    Collection Time: 11/05/19  5:33 PM   Result Value Ref Range    Lactic Acid 1.8 0.5 - 2.0 MMOL/L   TROPONIN-I    Collection Time: 11/05/19  5:33 PM   Result Value Ref Range    Troponin-I 0.01 0.0 - 0.05 NG/ML       Pertinent radiology reviewed.

## 2019-11-05 NOTE — Telephone Encounter
Received a message at approximately 0915 11/05/2019 that patient wishing to speak to discharging hospital team with new black stools after being discharged.  I called the patient to discuss.  He states that he started having black loose stools that started Wednesday after he got home after being discharged from the hospital.  He reports having 5 loose black stools on Wednesday, 4 on Thursday, 2 yesterday, 2 this morning.  He states that his stools have become more firm in the last 2 days.  Reports associated no energy.  Denies any dizziness or lightheadedness.    I recommended the patient have his son drive him to the emergency room for further evaluation.  He vocalized understanding and stated that his son was on his way to take him to [facility] at this time.

## 2019-11-06 LAB — CBC
Lab: 13 % (ref 11–15)
Lab: 2.7 M/UL — ABNORMAL LOW (ref 4.4–5.5)
Lab: 200 K/UL (ref 150–400)
Lab: 24 % — ABNORMAL LOW (ref 40–50)
Lab: 31 pg (ref 26–34)
Lab: 34 g/dL (ref 32.0–36.0)
Lab: 5.1 10*3/uL (ref 4.5–11.0)
Lab: 7.3 FL (ref 7–11)
Lab: 8.6 g/dL — ABNORMAL LOW (ref 13.5–16.5)
Lab: 89 FL (ref 80–100)

## 2019-11-06 MED ADMIN — AMOXICILLIN-POT CLAVULANATE 875-125 MG PO TAB [33228]: 875 mg | ORAL | @ 13:00:00 | Stop: 2019-11-06 | NDC 00093227534

## 2019-11-06 MED ADMIN — AMOXICILLIN-POT CLAVULANATE 875-125 MG PO TAB [33228]: 875 mg | ORAL | @ 23:00:00 | Stop: 2019-11-06 | NDC 00093227534

## 2019-11-07 MED ADMIN — FLU VACC QS2021-22(65YR UP)-PF 240 MCG/0.7 ML IM SYRG [457008]: 0.7 mL | INTRAMUSCULAR | @ 19:00:00 | Stop: 2019-11-07 | NDC 49281012188

## 2019-11-19 ENCOUNTER — Encounter: Admit: 2019-11-19 | Discharge: 2019-11-19 | Payer: MEDICARE

## 2019-11-19 MED ORDER — NITROGLYCERIN 0.4 MG SL SUBL
ORAL_TABLET | SUBLINGUAL | 0 refills | 9.00000 days | Status: AC | PRN
Start: 2019-11-19 — End: ?

## 2020-02-07 ENCOUNTER — Encounter: Admit: 2020-02-07 | Discharge: 2020-02-07 | Payer: MEDICARE

## 2020-04-02 ENCOUNTER — Encounter: Admit: 2020-04-02 | Discharge: 2020-04-02 | Payer: MEDICARE

## 2020-05-03 ENCOUNTER — Encounter: Admit: 2020-05-03 | Discharge: 2020-05-03 | Payer: MEDICARE

## 2020-05-24 ENCOUNTER — Encounter: Admit: 2020-05-24 | Discharge: 2020-05-24 | Payer: MEDICARE

## 2020-05-24 NOTE — Telephone Encounter
Pt called nurse vml confirming upcoming appt on 5/12 with SHT.

## 2020-05-31 ENCOUNTER — Encounter: Admit: 2020-05-31 | Discharge: 2020-05-31 | Payer: MEDICARE

## 2020-05-31 ENCOUNTER — Ambulatory Visit: Admit: 2020-05-31 | Discharge: 2020-05-31 | Payer: MEDICARE

## 2020-05-31 DIAGNOSIS — I2510 Atherosclerotic heart disease of native coronary artery without angina pectoris: Secondary | ICD-10-CM

## 2020-05-31 DIAGNOSIS — I447 Left bundle-branch block, unspecified: Secondary | ICD-10-CM

## 2020-05-31 DIAGNOSIS — S40862A Insect bite (nonvenomous) of left upper arm, initial encounter: Secondary | ICD-10-CM

## 2020-05-31 DIAGNOSIS — E785 Hyperlipidemia, unspecified: Secondary | ICD-10-CM

## 2020-05-31 DIAGNOSIS — Z905 Acquired absence of kidney: Secondary | ICD-10-CM

## 2020-05-31 DIAGNOSIS — I10 Essential (primary) hypertension: Secondary | ICD-10-CM

## 2020-05-31 DIAGNOSIS — Z95810 Presence of automatic (implantable) cardiac defibrillator: Secondary | ICD-10-CM

## 2020-05-31 DIAGNOSIS — Z8679 Personal history of other diseases of the circulatory system: Secondary | ICD-10-CM

## 2020-05-31 DIAGNOSIS — Z7901 Long term (current) use of anticoagulants: Secondary | ICD-10-CM

## 2020-05-31 DIAGNOSIS — T8092XA Unspecified transfusion reaction, initial encounter: Secondary | ICD-10-CM

## 2020-05-31 MED ORDER — DOXYCYCLINE HYCLATE 100 MG PO CAP
200 mg | ORAL_CAPSULE | Freq: Once | ORAL | 0 refills | 8.00000 days | Status: AC
Start: 2020-05-31 — End: ?
  Filled 2020-05-31: qty 2, 1d supply, fill #1

## 2020-05-31 NOTE — Patient Instructions
Heart findings are stable   No change to care  For Tick bite, take Doxycycline 200mg  once  Treat area of tick bite for comfort, if worsens contact local doctor

## 2020-05-31 NOTE — Progress Notes
Date of Service: 05/31/2020    Justin Farmer is a 80 y.o. male.       HPI     Patient is an 80 year old Caucasian male past medical history of coronary artery disease where he was having unstable angina symptoms which led to coronary angiograms and PCI to the LAD.  Has known left bundle branch block and left ventricular ejection fraction is typically been 40 to 45% since the initial intervention.  Patient had recurrent anginal findings and abnormal stress testing which led to subsequent restenting into the LAD.  No other changes to his cardiovascular findings.  He has been on high-dose beta-blocker but there were concerns about his left bundle branch block and low heart rate that led to biventricular ICD implantation in 2016.  Patient has never had identified sustained arrhythmia of the ventricle or shock intervention.  Does have a history of paroxysmal atrial fibrillation and is on chronic anticoagulation.  Reports that he used about 3 nitros earlier this year.  Has not taken anything for the last couple months.  Did have an ERCP for a gallbladder issue earlier this year and had some mild bleeding where he was hospitalized for monitoring a few days after the procedure/surgery.  Does not report significant time off of his warfarin or blood transfusion.  Is not having any other bleeding or bruising currently.  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  Without mowing his yard 2 days ago.  Noted a tick on his upper left arm that he removed.  Today his arm looks more swollen and uncomfortable.  No fevers or chills or other systemic type symptoms.         Vitals:    05/31/20 1220   BP: 126/70   BP Source: Arm, Left Upper   Pulse: 58   SpO2: 99%   O2 Device: None (Room air)   PainSc: Zero   Weight: 98.4 kg (217 lb)   Height: 175.3 cm (5' 9)     Body mass index is 32.05 kg/m?Marland Kitchen     Past Medical History  Patient Active Problem List    Diagnosis Date Noted   ? Melena 11/05/2019   ? Transfusion reaction 11/01/2019       Allergic Transfusion Reaction    NAME:Justin Farmer             MRN: [id number]             DOB:1940-06-16          AGE: 80 y.o.  ADMISSION DATE: 10/30/2019             DAYS ADMITTED: LOS: 1 day    Justin Farmer is 80 y.o. male with history of INR 3.1 with obstructive jaundice secondary to choledocholithiasis on warfarin for A Fib who received 223 mL of plasma with no premedication.  Patient developed rash, pruritus and hives on arm and torso 60 minutes after the transfusion started.  Patient did not have stridor and wheezing.     Vital Signs:  Temperature: 36.9C to 36.8C; Blood Pressure: 135/59 mmHg to 145/47mmHg; Pulse: 64 to 70; Respirations: 15 to 22; O2Sat: 97% to 95% on Room Air.  Patient was treated with diphenhydramine and symptoms resolved quickly.  Blood bank investigation for hemolysis was negative.    Physician Interpretation:   Allergic Reaction.    Severity: Non-severe    Imputability: Definite    Recommendation:   (1) Resumption of current transfusion  is acceptable if symptoms resolved quickly.   (2) Premedication with antihistamine 45 minutes to 1 hour before future transfusion may be beneficial.               ? Common bile duct dilatation 10/31/2019   ? Left rotator cuff tear 01/26/2017   ? Biventricular automatic implantable cardioverter defibrillator in situ 07/14/2016   ? Coronary artery disease involving native coronary artery of native heart without angina pectoris 12/08/2014     1. 17/07 pharmacologic stress study: Mildly abnormal with slight degree of ischemia in mid to distal inferolateral wall in circumflex distribution. EF 49%, although visually appeared to be 55%. EDV 87 mL.  2. 02/14/05 cardiac catheterization: LAD proximal 40-50% stenosis, proximal mid portion 60% followed by 70-75% stenosis between the first and second diagonal branches. Second diagonal branch 60% ostial stenosis. Circumflex first OM with proximal 50-60% stenosis. RCA 20-30% luminal irregularity in the proximal mid segment. Normal end-diastolic pressure 6-8 mmHg. Left ventriculogram not performed.  3. 05/08/05 coronary angiogram: Left main free of significant disease, LAD proximal 30-40% stenosis followed by 70% mid stenosis between the first and second diagonal branches, 50% stenosis in the LAD at bifurcation or second diagonal branch. Second diagonal branch ostial 60% stenosis. Left circumflex no significant disease. Large obtuse marginal branch proximal 40% stenosis. Right coronary artery 20% luminal narrowing in mid RCA. FFR proximal-mid LAD at baseline 0.90, at maximal hyperemia 0.72. FFR of first obtuse marginal 0.97 at rest, decreased to 0.91 with maximum hyperemia with adenosine.? Status post PCI and stenting of proximal to mid left anterior descending artery with 3.0- x 30.0-mm Driver bare-metal stent.  4. 05/11/2006, exercise thallium.? The patient exercised for 8:33 minutes,achieving 89 percent of MPHR.? Stress thallium negative for ischemia.? His stress ECG negative for ischemia.? EF 59 %.  5. 06/08/08: [facility]icon stress thallium: No significant myocardial ischemia. There is diaphragmatic attenuation/and due to hot spot artifact involving the inferior wall. EF 66%  6. 07/18/11: Probably abnormal and demonstrates a relatively small, reversible anteroseptal and apical septal defect. There is also a fixed, true apical component noted. This may suggest limited ischemia in an LAD distribution. EF 53%  7. 08/03/11: Cardiac Cath: Moderate 70% lesion right after the previously placed stent in the proximal LAD.FFR dropped from 0.86 to 0.67. Moderate 40 percent to 50 percent lesion in the large first obtuse marginal vessel, mild 30 percent lesion in the proximal to mid RCA.LAD- stented using a bare-metal stent, Integrity 3.0 x 12 mm and followed by another stent of 2.5 x 12 mm in overlapping fashion.  8. 08/25/11 Echo:? EF 40-45%.? Mild left atrial enlargement.? Mild concentric LVH.? Calcific thickening of noncoronary cusp of aortic valve with no stenosis or ?regurgitation.? PAP=34 mmHg.  9. 06/09/13 Regadenoson Thallium:? EF 42%.? No definite perfusion defects.  10. 09/18/2016: Reg stress thall: EF 55%. probably normal with no evidence of significant myocardial ischemia. The mild fixed reduction in inferolateral/apical lateral tracer activity is probably related to diaphragmatic/soft tissue attenuation     ? Chronic anticoagulation 12/08/2014   ? 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 08/25/11: Echo:EF=40- 45%. Mild left atrial enlargement. Mild LVH. Calcific thickening of noncoronary cusp of aortic valve. No stenosis or regurgitation. PAP 34  2. 10/06/12 Echo:  EF 45%.  LA mildly enlarged.  3. 10/06/12: Echo: LV EF is about 45%. The LA is mildly enlarged. Mild MR/TR  4. 12/08/14 Echo:  EF 35%. Sclerotic AV with not stenosis or regurgitation.  Mildly dilated sinuses  of valsalva -3.7 cm. PAP=27 mmHg.  5. 01/02/2015 CRT-D implant  6. 07/19/15 2D Echo:  EF 40%.  Sclerotic AV.  Mildly dilated sinuses al valsalva (3.9 cm)  7. 08/13/18: Echo: EF 55%.Interventricular septal motion consistent with pacing. Grade I diastolic dysfunction. No significant valvular abnormality. PASP 26.         ? Abnormal cardiovascular stress test 07/31/2011   ? Abnormal thallium stress test 07/31/2011   ? Paroxysmal atrial fibrillation (HCC) 02/22/2011     01/2011: TEE: EF 40-45%. Moderate left atrial enlargement. Mild right atrial enlargement. Mild MR/TR. No thrombus is noted in the left atrial appendage or the left atrium.   02/21/11: Cardioversion: 200 J biphasic  INR followed by PCP, Lynne Leader MD - as of 08/05/11  11/06/14 Holter Monitor:  46 hour Holter monitor demonstrates sinus rhythm with rare premature atrial complex beats (21 in 46 hours) and rare premature ventricular     complex beats (29 in 46 hours).? No episode of significant atrial or ventricular sustained arrhythmia.? No symptoms reported.  ?     ? LBBB (left bundle branch block) 06/05/2009     05/2008: rate related during stress test and noted again on 06/01/09 at rate of 54 bpm     ? Essential hypertension 06/07/2008   ? 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 HLD (hyperlipidemia) 06/07/2008         Review of Systems   Constitutional: Negative.   HENT: Negative.    Eyes: Negative.    Cardiovascular: Negative.    Respiratory: Negative.    Endocrine: Negative.    Hematologic/Lymphatic: Negative.    Skin: Negative.    Musculoskeletal: Negative.    Gastrointestinal: Negative.    Genitourinary: Negative.    Neurological: Negative.    Psychiatric/Behavioral: Negative.    Allergic/Immunologic: Negative.        Physical Exam  Awake and alert in no acute distress.  Appears given age  Pupils equal react without scleral injection  Neck is supple normal carotid stroke and no bruits.  No jugular venous abnormalities or masses  Lungs are clear to auscultation.  Chest shows device locations in tact with no significant discomfort or irritation  Heart S1, S2.  I do not appreciate any significant murmurs, clicks, or gallops  Abdomen is obese but soft and nontender  Pulse are 2+, regular, and symmetric bilaterally radial as well as dorsalis pedis locations.  No significant edema  Does have a raised pink welted area at the medial aspect of the left upper arm.  I do not see any focal ulceration or skin disruption      Cardiovascular Studies    Review of device shows that he paces 30 to 40% of the atria and greater than 98% in the ventricles.  No sustained arrhythmias or significant ectopy.  OptiVol has been stable and within goal range.    Cardiovascular Health Factors  Vitals BP Readings from Last 3 Encounters:   05/31/20 126/70   11/07/19 119/63   11/02/19 (!) 118/91     Wt Readings from Last 3 Encounters:   05/31/20 98.4 kg (217 lb)   11/05/19 94.1 kg (207 lb 8 oz)   10/31/19 93 kg (205 lb)     BMI Readings from Last 3 Encounters:   05/31/20 32.05 kg/m?   11/05/19 30.64 kg/m?   10/31/19 30.27 kg/m?      Smoking Social History  Tobacco Use   Smoking Status Never Smoker   Smokeless Tobacco Never Used      Lipid Profile Cholesterol   Date Value Ref Range Status   10/31/2019 95 <200 MG/DL Final     HDL   Date Value Ref Range Status   10/31/2019 22 (L) >40 MG/DL Final     LDL   Date Value Ref Range Status   10/31/2019 47 <100 mg/dL Final     Triglycerides   Date Value Ref Range Status   10/31/2019 136 <150 MG/DL Final      Blood Sugar Hemoglobin A1C   Date Value Ref Range Status   10/31/2019 6.8 (H) 4.0 - 6.0 % Final     Comment:     The ADA recommends that most patients with type 1 and type 2 diabetes maintain   an A1c level <7%.       Glucose   Date Value Ref Range Status   11/07/2019 115 (H) 70 - 100 MG/DL Final   04/54/0981 191 (H) 70 - 100 MG/DL Final   47/82/9562 130 (H) 70 - 100 MG/DL Final   86/57/8469 629 (H) 70 - 110 MG/DL Final   52/84/1324 401 (H) 70 - 110 MG/DL Final     Glucose, POC   Date Value Ref Range Status   02/21/2011 160 (H) 70 - 100 MG/DL Final   02/72/5366 440 (H) 70 - 100 MG/DL Final   34/74/2595 638 (H) 70 - 100 MG/DL Final          Problems Addressed Today  Encounter Diagnoses   Name Primary?   ? LBBB (left bundle branch block) Yes   ? Biventricular automatic implantable cardioverter defibrillator in situ    ? Coronary artery disease involving native coronary artery of native heart without angina pectoris    ? Tick bite of left upper arm, initial encounter        Assessment and Plan     Cardiovascular fines are clinically stable.  Hemodynamics are good and findings device and symptoms are good.  At this time he no changes therapy and his INR has been therapeutic and maintained by his primary physician.  Because of the recent tick bite and the local tissue changes will treat prophylactically with doxycycline daily milligrams x1.  Informed the patient to use cool compresses and topical agents and compression as needed to control symptoms at the area that was affected on the arm.  If this deteriorates or he has worsening symptoms he should be seen by his general physician.  We will continue routine monitoring of his device and otherwise follow-up in the clinic and be in approximate 1 year.         Current Medications (including today's revisions)  ? aspirin EC 81 mg tablet Take 1 Tab by mouth daily.   ? atorvastatin (LIPITOR) 40 mg tablet Take one tablet by mouth daily.   ? colestipoL (COLESTID) 1 gram tablet Take 1 g by mouth daily.   ? doxycycline hyclate (VIBRAMYCIN) 100 mg capsule Take two capsules by mouth once for 1 dose. Indications: infection prophylaxis, medical   ? finasteride (PROSCAR) 5 mg tablet Take 5 mg by mouth daily.   ? hydroCHLOROthiazide (HYDRODIURIL) 25 mg tablet TAKE ONE TABLET BY MOUTH EVERY MORNING.   ? HYDROcodone/acetaminophen (NORCO) 5/325 mg tablet Take 1-2 Tabs by mouth every 4 hours as needed for Pain (Patient taking differently: Take 1-2 tablets by mouth every 4 hours as needed for Pain.  PRN  USE (medication a few years old and still uses if needed but does not like how it makes him feel))   ? losartan (COZAAR) 50 mg tablet TAKE ONE TABLET BY MOUTH DAILY (Patient taking differently: Take 50 mg by mouth at bedtime daily.)   ? metoprolol XL (TOPROL XL) 200 mg extended release tablet Take one tablet by mouth daily.   ? nitroglycerin (NITROSTAT) 0.4 mg tablet PLACE 1 TABLET UNDER TONGUE EVERY 5 MINUTES AS NEEDED FOR CHEST PAIN. MAY REPEAT FOR 3 DOSES   ? omeprazole DR (PRILOSEC) 20 mg capsule Take 20 mg by mouth daily before breakfast.   ? tamsulosin (FLOMAX) 0.4 mg capsule Take 1 Cap by mouth Daily.   ? VIT C/VIT E ACETATE/LUTEIN/MIN (OCUVITE LUTEIN PO) Take 1 Tab by mouth daily.   ? warfarin (COUMADIN) 5 mg tablet Take 1 Tab by mouth at bedtime daily. as directed based on INR.

## 2020-06-11 ENCOUNTER — Encounter: Admit: 2020-06-11 | Discharge: 2020-06-11 | Payer: MEDICARE

## 2020-06-11 MED ORDER — ATORVASTATIN 40 MG PO TAB
40 mg | ORAL_TABLET | Freq: Every day | ORAL | 3 refills | Status: AC
Start: 2020-06-11 — End: ?

## 2020-07-19 ENCOUNTER — Encounter: Admit: 2020-07-19 | Discharge: 2020-07-19 | Payer: MEDICARE

## 2020-07-19 MED ORDER — LOSARTAN 50 MG PO TAB
50 mg | ORAL_TABLET | Freq: Every day | ORAL | 3 refills | 30.00000 days | Status: AC
Start: 2020-07-19 — End: ?

## 2020-08-06 ENCOUNTER — Encounter: Admit: 2020-08-06 | Discharge: 2020-08-06 | Payer: MEDICARE

## 2020-08-06 DIAGNOSIS — Z95810 Presence of automatic (implantable) cardiac defibrillator: Secondary | ICD-10-CM

## 2020-09-14 ENCOUNTER — Encounter: Admit: 2020-09-14 | Discharge: 2020-09-14 | Payer: MEDICARE

## 2020-09-14 NOTE — Telephone Encounter
Patient called to discuss CP he experienced on 8/24 at night. He took 2 nitros that relieved his pain. He had no other symptoms associated with this. Feels fine today 8/26 and denies any symptoms since then. Is wanting to be seen soon. Discussed when to seek emergent care and verbalized understanding.

## 2020-09-20 ENCOUNTER — Encounter: Admit: 2020-09-20 | Discharge: 2020-09-20 | Payer: MEDICARE

## 2020-09-20 NOTE — Telephone Encounter
Pt. called our office to confirm his appt. with Noreene Larsson on 9/8.

## 2020-09-27 ENCOUNTER — Encounter: Admit: 2020-09-27 | Discharge: 2020-09-27 | Payer: MEDICARE

## 2020-09-27 ENCOUNTER — Ambulatory Visit: Admit: 2020-09-27 | Discharge: 2020-09-27 | Payer: MEDICARE

## 2020-09-27 DIAGNOSIS — M25512 Pain in left shoulder: Secondary | ICD-10-CM

## 2020-09-27 DIAGNOSIS — I2510 Atherosclerotic heart disease of native coronary artery without angina pectoris: Secondary | ICD-10-CM

## 2020-09-27 DIAGNOSIS — Z905 Acquired absence of kidney: Secondary | ICD-10-CM

## 2020-09-27 DIAGNOSIS — E7800 Pure hypercholesterolemia, unspecified: Secondary | ICD-10-CM

## 2020-09-27 DIAGNOSIS — Z8679 Personal history of other diseases of the circulatory system: Secondary | ICD-10-CM

## 2020-09-27 DIAGNOSIS — E785 Hyperlipidemia, unspecified: Secondary | ICD-10-CM

## 2020-09-27 DIAGNOSIS — Z7901 Long term (current) use of anticoagulants: Secondary | ICD-10-CM

## 2020-09-27 DIAGNOSIS — Z95810 Presence of automatic (implantable) cardiac defibrillator: Secondary | ICD-10-CM

## 2020-09-27 DIAGNOSIS — T8092XA Unspecified transfusion reaction, initial encounter: Secondary | ICD-10-CM

## 2020-09-27 DIAGNOSIS — I428 Other cardiomyopathies: Secondary | ICD-10-CM

## 2020-09-27 DIAGNOSIS — R0989 Other specified symptoms and signs involving the circulatory and respiratory systems: Principal | ICD-10-CM

## 2020-09-27 DIAGNOSIS — I10 Essential (primary) hypertension: Secondary | ICD-10-CM

## 2020-09-27 DIAGNOSIS — I447 Left bundle-branch block, unspecified: Secondary | ICD-10-CM

## 2020-09-27 DIAGNOSIS — I480 Paroxysmal atrial fibrillation: Secondary | ICD-10-CM

## 2020-09-27 NOTE — Progress Notes
[facility]  Phone: [phone]  Fax: [phone]    Nuclear Stress Test Instructions      PATIENT: Natasha Paulson    MRN#:  [id number]    PLEASE REPORT TO:    __x__[facility] ([address], [place], [place]) - [phone]   ____x[facility] Main Phone Number: [phone]     Date of Test  ___09/22/_________  at ______10 am______  for ______nuclear stress test__________________    Are you able to raise your arm up by your head for about 20 minutes? yes  Can you lie on your back for approximately 20 minutes with minimal movement? yes    The Thallium evaluation has two parts -- two nuclear scans.  The first scan is done in the morning and the second three to four hours later.   Wear comfortable clothing. Shorts or pants. (No dresses or skirts please).  Bring or wear sneakers/walking shoes if you are walking on [place].  Please let the nuclear technologists know if you plan on flying after the test.    NO CAFFEINE 24 HOURS PRIOR TO TEST. Examples: coffee, tea, decaf drinks, cola, chocolate.     DO NOT EAT OR DRINK THE MORNING OF YOUR TEST unless otherwise instructed. (You may have a couple sips of water).    If you are a diabetic, if insulin dependent: please take one third of your insulin with two pieces of dry toast and a small juice). Bring insulin and medication with you to the test.     ___ TAKE MORNING MEDICATIONS WITH A COUPLE SIPS OF WATER PRIOR TO TEST.     HOLD THE FOLLOWING MEDICATIONS AS INDICATED BELOW:      ? Water pills -- HCTZ (hydrochlorothiazide) -- on the morning before the procedure.   ?   ? OVER THE COUNTER MEDICATIONS       WHAT TO DO BETWEEN THE FIRST TWO THALLIUM SCANS:  1. No strenuous exercise should be performed during this time.  2. A light lunch is permissible. The technologist will give you a list of appropriate foods.  3. Please return 15 minutes prior to the schedule of your second scan. Our nuclear technologist will tell you exactly what time to return.  4. Please do not use tobacco products in between scans.  5. After the first scan is completed, you may resume usual medications.     TEST FINDINGS:  You will receive the results of the test within 7 business days of its completion by telephone, unless arranged differently at the time of the procedure.  If you have any questions concerning your thallium test or if you do not hear from your CVM physician/or nurse within 7 business days, please call the appropriate office checked above.      Instructions given by Mliss Sax, LPN

## 2020-09-27 NOTE — Patient Instructions
-   It was nice to see you today!    - I think everything sounds stable, but let's repeat a stress test since you had that episode of chest pain.    - We will request your recent labs from Dr. Perlie Gold.    - I have requested a 3 month follow up visit. Please send [place] a MyChart message or call our nurse line at [phone] with any questions or concerns in the meantime.

## 2020-09-28 ENCOUNTER — Encounter: Admit: 2020-09-28 | Discharge: 2020-09-28 | Payer: MEDICARE

## 2020-09-28 DIAGNOSIS — I10 Essential (primary) hypertension: Secondary | ICD-10-CM

## 2020-09-28 DIAGNOSIS — I2510 Atherosclerotic heart disease of native coronary artery without angina pectoris: Secondary | ICD-10-CM

## 2020-09-28 DIAGNOSIS — Z905 Acquired absence of kidney: Secondary | ICD-10-CM

## 2020-09-28 DIAGNOSIS — Z8679 Personal history of other diseases of the circulatory system: Secondary | ICD-10-CM

## 2020-09-28 DIAGNOSIS — E785 Hyperlipidemia, unspecified: Secondary | ICD-10-CM

## 2020-09-28 DIAGNOSIS — Z7901 Long term (current) use of anticoagulants: Secondary | ICD-10-CM

## 2020-09-28 DIAGNOSIS — T8092XA Unspecified transfusion reaction, initial encounter: Secondary | ICD-10-CM

## 2020-09-28 DIAGNOSIS — I447 Left bundle-branch block, unspecified: Secondary | ICD-10-CM

## 2020-10-01 ENCOUNTER — Encounter: Admit: 2020-10-01 | Discharge: 2020-10-01 | Payer: MEDICARE

## 2020-10-01 ENCOUNTER — Ambulatory Visit: Admit: 2020-10-01 | Discharge: 2020-10-01 | Payer: MEDICARE

## 2020-10-01 DIAGNOSIS — Z905 Acquired absence of kidney: Secondary | ICD-10-CM

## 2020-10-01 DIAGNOSIS — I10 Essential (primary) hypertension: Secondary | ICD-10-CM

## 2020-10-01 DIAGNOSIS — E785 Hyperlipidemia, unspecified: Secondary | ICD-10-CM

## 2020-10-01 DIAGNOSIS — S46012A Strain of muscle(s) and tendon(s) of the rotator cuff of left shoulder, initial encounter: Secondary | ICD-10-CM

## 2020-10-01 DIAGNOSIS — Z7901 Long term (current) use of anticoagulants: Secondary | ICD-10-CM

## 2020-10-01 DIAGNOSIS — Z8679 Personal history of other diseases of the circulatory system: Secondary | ICD-10-CM

## 2020-10-01 DIAGNOSIS — I2510 Atherosclerotic heart disease of native coronary artery without angina pectoris: Secondary | ICD-10-CM

## 2020-10-01 DIAGNOSIS — T8092XA Unspecified transfusion reaction, initial encounter: Secondary | ICD-10-CM

## 2020-10-01 DIAGNOSIS — I447 Left bundle-branch block, unspecified: Secondary | ICD-10-CM

## 2020-10-01 DIAGNOSIS — M25512 Pain in left shoulder: Secondary | ICD-10-CM

## 2020-10-02 ENCOUNTER — Encounter: Admit: 2020-10-02 | Discharge: 2020-10-02 | Payer: MEDICARE

## 2020-10-03 ENCOUNTER — Encounter: Admit: 2020-10-03 | Discharge: 2020-10-03 | Payer: MEDICARE

## 2020-10-03 NOTE — Progress Notes
Labs entered; sent to J.Tibke, APRn to review.

## 2020-10-09 ENCOUNTER — Encounter: Admit: 2020-10-09 | Discharge: 2020-10-09 | Payer: MEDICARE

## 2020-10-11 ENCOUNTER — Encounter: Admit: 2020-10-11 | Discharge: 2020-10-11 | Payer: MEDICARE

## 2020-10-16 ENCOUNTER — Encounter: Admit: 2020-10-16 | Discharge: 2020-10-16 | Payer: MEDICARE

## 2020-10-16 ENCOUNTER — Ambulatory Visit: Admit: 2020-10-16 | Discharge: 2020-10-16 | Payer: MEDICARE

## 2020-10-16 DIAGNOSIS — I2510 Atherosclerotic heart disease of native coronary artery without angina pectoris: Secondary | ICD-10-CM

## 2020-10-16 MED ORDER — RP DX TC-99M TETROFOSMIN MCI
14.5 | Freq: Once | INTRAVENOUS | 0 refills | Status: CP
Start: 2020-10-16 — End: ?
  Administered 2020-10-16: 17:00:00 14.5 via INTRAVENOUS

## 2020-10-16 MED ORDER — ALBUTEROL SULFATE 90 MCG/ACTUATION IN HFAA
2 | RESPIRATORY_TRACT | 0 refills | Status: AC | PRN
Start: 2020-10-16 — End: ?

## 2020-10-16 MED ORDER — EUCALYPTUS-MENTHOL MM LOZG
1 | Freq: Once | ORAL | 0 refills | Status: AC | PRN
Start: 2020-10-16 — End: ?

## 2020-10-16 MED ORDER — SODIUM CHLORIDE 0.9 % IV SOLP
250 mL | INTRAVENOUS | 0 refills | Status: AC | PRN
Start: 2020-10-16 — End: ?

## 2020-10-16 MED ORDER — NITROGLYCERIN 0.4 MG SL SUBL
.4 mg | SUBLINGUAL | 0 refills | Status: AC | PRN
Start: 2020-10-16 — End: ?

## 2020-10-16 MED ORDER — REGADENOSON 0.4 MG/5 ML IV SYRG
.4 mg | Freq: Once | INTRAVENOUS | 0 refills | Status: CP
Start: 2020-10-16 — End: ?
  Administered 2020-10-16: 17:00:00 0.4 mg via INTRAVENOUS

## 2020-10-16 MED ORDER — AMINOPHYLLINE 500 MG/20 ML IV SOLN
50 mg | INTRAVENOUS | 0 refills | Status: AC | PRN
Start: 2020-10-16 — End: ?
  Administered 2020-10-16: 17:00:00 50 mg via INTRAVENOUS

## 2020-10-16 MED ORDER — RP DX TC-99M TETROFOSMIN MCI
5.4 | Freq: Once | INTRAVENOUS | 0 refills | Status: CP
Start: 2020-10-16 — End: ?
  Administered 2020-10-16: 15:00:00 5.4 via INTRAVENOUS

## 2020-10-18 ENCOUNTER — Encounter: Admit: 2020-10-18 | Discharge: 2020-10-18 | Payer: MEDICARE

## 2020-10-18 NOTE — Telephone Encounter
Patient given results and recommendations as below. Denied any further chest pain at this time. Plans to continue to monitor.

## 2020-10-18 NOTE — Telephone Encounter
-----   Message from Bo Mcclintock, APRN-NP sent at 10/18/2020  7:51 AM CDT -----  Will you please let Mr. Eggert know that his stress test is a low risk/probably normal study? If he has experienced any further chest pain, we can trial him on Imdur 30 mg daily in the am. If he has not, we can monitor. Thanks, Noreene Larsson

## 2020-11-06 ENCOUNTER — Encounter: Admit: 2020-11-06 | Discharge: 2020-11-06 | Payer: MEDICARE

## 2020-11-06 NOTE — Telephone Encounter
-----   Message from Janett Labella, RN sent at 11/06/2020  2:29 PM CDT -----  Regarding: FW: OptiVol Elevated, RCP Patient  This patient hasn't seen EP in 3 years. Please follow up. thanks  ----- Message -----  From: Almira Coaster  Sent: 11/06/2020   2:26 PM CDT  To: Cvm Nurse Ep Team A  Subject: OptiVol Elevated, RCP Patient                    Good morning,    This patient had an elevated OptiVol suggesting fluid or heart failure symptoms. Please see attached. Thank you.

## 2020-11-06 NOTE — Telephone Encounter
Spoke with pt about his remote device alert. Denies any symptoms suggesting fluid accumulation. Denies CP, swelling, SOB. Reports he feels fine. No further concerns or questions at this time.

## 2020-12-20 ENCOUNTER — Encounter: Admit: 2020-12-20 | Discharge: 2020-12-20 | Payer: MEDICARE

## 2020-12-20 NOTE — Telephone Encounter
-----   Message from Amada Kingfisher, RN sent at 12/20/2020 11:22 AM CST -----  Please f/u patient has not been seen by RCP since 2018.     Thanks!    ----- Message -----  From: Almira Coaster  Sent: 12/20/2020  10:06 AM CST  To: Cvm Nurse Ep Team A  Subject: Elevated Optivol, RCP Patient                    Good morning,    This patient had a remote with continued optivol elevated. Patient may have fluid or heart failure symptoms. Please see attached. Thank you.

## 2020-12-20 NOTE — Progress Notes
STAT request please    Patient DOB: 07/26/1940    Please fax the following information for continuation of care:      Medication list   Please send d/c summary, labs and EKG from recent ED visit.   Please include any additional testing or consults completed during visit.   Cardiac testing; Stress tests, Echo's, Calcium scores etc.    Patient has an upcoming appointment with Dr. Harvel Ricks      *Please fax to [phone]    Thank you!    Fredric Mare, RN

## 2020-12-26 ENCOUNTER — Encounter: Admit: 2020-12-26 | Discharge: 2020-12-26 | Payer: MEDICARE

## 2020-12-26 ENCOUNTER — Ambulatory Visit: Admit: 2020-12-26 | Discharge: 2020-12-26 | Payer: MEDICARE

## 2020-12-26 DIAGNOSIS — Z7901 Long term (current) use of anticoagulants: Secondary | ICD-10-CM

## 2020-12-26 DIAGNOSIS — Z8679 Personal history of other diseases of the circulatory system: Secondary | ICD-10-CM

## 2020-12-26 DIAGNOSIS — Z905 Acquired absence of kidney: Secondary | ICD-10-CM

## 2020-12-26 DIAGNOSIS — R6889 Other general symptoms and signs: Secondary | ICD-10-CM

## 2020-12-26 DIAGNOSIS — I2510 Atherosclerotic heart disease of native coronary artery without angina pectoris: Secondary | ICD-10-CM

## 2020-12-26 DIAGNOSIS — I447 Left bundle-branch block, unspecified: Secondary | ICD-10-CM

## 2020-12-26 DIAGNOSIS — T8092XA Unspecified transfusion reaction, initial encounter: Secondary | ICD-10-CM

## 2020-12-26 DIAGNOSIS — E785 Hyperlipidemia, unspecified: Secondary | ICD-10-CM

## 2020-12-26 DIAGNOSIS — I10 Essential (primary) hypertension: Secondary | ICD-10-CM

## 2020-12-26 DIAGNOSIS — R4781 Slurred speech: Secondary | ICD-10-CM

## 2020-12-26 MED ORDER — LOSARTAN 100 MG PO TAB
100 mg | ORAL_TABLET | Freq: Every day | ORAL | 3 refills | 90.00000 days | Status: AC
Start: 2020-12-26 — End: ?

## 2020-12-26 NOTE — Patient Instructions
Increase losartan to 100 mg daily.  Check blood pressure at least an hour after taking morning medications and again later in the afternoon or evening  If blood pressure is greater than 180, take a clonidine.  After 2 weeks, call us if blood pressure is consistently greater than 150.  We will try to schedule MRI on the same day as your next visit, Jan 30th.

## 2021-01-04 ENCOUNTER — Encounter: Admit: 2021-01-04 | Discharge: 2021-01-04 | Payer: MEDICARE

## 2021-01-04 NOTE — Telephone Encounter
Pt. called our office this morning to let us know his BP has still been running high with readings as high as 190's/100's. Pt. has been taking his clonidine as well and this will some times bring it down to around 140's/90's. HR have been in the 60's. Pt. losartan was increased to 100mg  at last OV. Pt. states he is still holding his HCTZ due to increase in creatinine. Pt. states he has been watching his sodium intake as well. I advised pt. I would discuss with Dr. to see what she would recommend.

## 2021-01-07 ENCOUNTER — Encounter: Admit: 2021-01-07 | Discharge: 2021-01-07 | Payer: MEDICARE

## 2021-01-07 DIAGNOSIS — Z8679 Personal history of other diseases of the circulatory system: Secondary | ICD-10-CM

## 2021-01-07 DIAGNOSIS — Z7901 Long term (current) use of anticoagulants: Secondary | ICD-10-CM

## 2021-01-07 DIAGNOSIS — I10 Essential (primary) hypertension: Secondary | ICD-10-CM

## 2021-01-07 DIAGNOSIS — T8092XA Unspecified transfusion reaction, initial encounter: Secondary | ICD-10-CM

## 2021-01-07 DIAGNOSIS — Z905 Acquired absence of kidney: Secondary | ICD-10-CM

## 2021-01-07 DIAGNOSIS — I447 Left bundle-branch block, unspecified: Secondary | ICD-10-CM

## 2021-01-07 DIAGNOSIS — E785 Hyperlipidemia, unspecified: Secondary | ICD-10-CM

## 2021-01-07 DIAGNOSIS — I2510 Atherosclerotic heart disease of native coronary artery without angina pectoris: Secondary | ICD-10-CM

## 2021-01-15 ENCOUNTER — Encounter: Admit: 2021-01-15 | Discharge: 2021-01-15 | Payer: MEDICARE

## 2021-01-15 NOTE — Progress Notes
Request for the following medical records for purpose of continuity of care:       PT: Justin Farmer  DOB: 1940-04-10    Patient reports ER visit on 01/13/2021, please send all records from this visit. Thanks.       Please Fax to: [phone]  Cardiology Services at [facility]   Dr. Harvel Ricks   Attention:  Caryn Bee, RN    Thank you

## 2021-01-17 ENCOUNTER — Encounter: Admit: 2021-01-17 | Discharge: 2021-01-17 | Payer: MEDICARE

## 2021-01-30 ENCOUNTER — Encounter: Admit: 2021-01-30 | Discharge: 2021-01-30 | Payer: MEDICARE

## 2021-02-06 ENCOUNTER — Encounter: Admit: 2021-02-06 | Discharge: 2021-02-06 | Payer: MEDICARE

## 2021-02-06 DIAGNOSIS — Z95810 Presence of automatic (implantable) cardiac defibrillator: Secondary | ICD-10-CM

## 2021-02-18 ENCOUNTER — Ambulatory Visit: Admit: 2021-02-18 | Discharge: 2021-02-18 | Payer: MEDICARE

## 2021-02-18 ENCOUNTER — Encounter: Admit: 2021-02-18 | Discharge: 2021-02-18 | Payer: MEDICARE

## 2021-02-18 DIAGNOSIS — I255 Ischemic cardiomyopathy: Secondary | ICD-10-CM

## 2021-02-18 DIAGNOSIS — Z95810 Presence of automatic (implantable) cardiac defibrillator: Secondary | ICD-10-CM

## 2021-02-18 DIAGNOSIS — I480 Paroxysmal atrial fibrillation: Secondary | ICD-10-CM

## 2021-02-18 DIAGNOSIS — T8092XA Unspecified transfusion reaction, initial encounter: Secondary | ICD-10-CM

## 2021-02-18 DIAGNOSIS — I447 Left bundle-branch block, unspecified: Secondary | ICD-10-CM

## 2021-02-18 DIAGNOSIS — I2510 Atherosclerotic heart disease of native coronary artery without angina pectoris: Secondary | ICD-10-CM

## 2021-02-18 DIAGNOSIS — Z7901 Long term (current) use of anticoagulants: Secondary | ICD-10-CM

## 2021-02-18 DIAGNOSIS — Z8679 Personal history of other diseases of the circulatory system: Secondary | ICD-10-CM

## 2021-02-18 DIAGNOSIS — I10 Essential (primary) hypertension: Secondary | ICD-10-CM

## 2021-02-18 DIAGNOSIS — Z905 Acquired absence of kidney: Secondary | ICD-10-CM

## 2021-02-18 DIAGNOSIS — E785 Hyperlipidemia, unspecified: Secondary | ICD-10-CM

## 2021-02-18 MED ORDER — HYDRALAZINE 25 MG PO TAB
25 mg | ORAL_TABLET | Freq: Two times a day (BID) | ORAL | 1 refills | 42.50000 days | Status: AC
Start: 2021-02-18 — End: ?

## 2021-02-18 NOTE — Progress Notes
Date of Service: 02/18/2021    Justin Farmer is a 81 y.o. male.       HPI     Patient is an 81 year old Caucasian male past medical history of coronary artery disease had previous issues with angina led to stenting initially to the LAD, had subsequent angina had recently to the LAD.  Left ventricular ejection fraction at 1 time was reported as low as 35%,  in 2016 and received a biventricular ICD.  More recent evaluation of his left ventricular ejection fraction shows to be more formed at 45%.  He has a known permanent left bundle branch block, paroxysmal atrial fibrillation that is relatively asymptomatic and otherwise been stable by monitoring of his device.  He has had stable pacing of his device at about 40 to 50% in the atrium and greater than 90% of the ventricles.  Notes he was having frequent severe headaches in the evenings of the last 1 or 2 months.  Been found that his blood pressure was higher with ranges above systolics of 180s in the evening when his blood pressures were higher.  No chest pain, palpitations, lightheadedness.  He had undergone nuclear cardiac stress test in September that showed stable heart function and no findings for significant ischemia.  Device checks have showed stable cardiac rhythm findings and no change in pacing or OptiVol readings.  He is not having orthopnea or PND.  Does take an occasional sleeping pill at night and is prone to fall asleep during the day.  Does have a history of snoring.  His son has been spending more time out there with him especially once colder.  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  Urinary findings renal function has been stable.  No findings for anemia or other infectious events.  There have been concerns about his forgetfulness, but reports good compliance with medications and no focal neurological deficits.  When evaluated in December for the elevated blood pressure was given clonidine to use as needed.  When seen in clinic had his losartan increased to 200 mg p.o. nightly in addition to his hydrochlorothiazide and metoprolol XL in the a.m. his blood pressure really has not changed since increasing the dose of losartan.  Tolerated the medication change otherwise.  Does see a decrease in his blood pressure shortly after taking the clonidine, headaches are improved with this.            Vitals:    02/18/21 1138   BP: 132/84   BP Source: Arm, Left Upper   Pulse: 60   O2 Device: None (Room air)   PainSc: Zero   Weight: 95.3 kg (210 lb)   Height: 177.8 cm (5' 10)     Body mass index is 30.13 kg/m?Marland Kitchen     Past Medical History  Patient Active Problem List    Diagnosis Date Noted   ? Other cardiomyopathies (HCC) 09/27/2020   ? Melena 11/05/2019   ? Transfusion reaction 11/01/2019       Allergic Transfusion Reaction    NAME:Justin Farmer             MRN: [id number]             DOB:[birth year]          AGE: 81 y.o.  ADMISSION DATE: 10/30/2019             DAYS ADMITTED: LOS: 1 day    Justin Farmer is  81 y.o. male with history of INR 3.1 with obstructive jaundice secondary to choledocholithiasis on warfarin for A Fib who received 223 mL of plasma with no premedication.  Patient developed rash, pruritus and hives on arm and torso 60 minutes after the transfusion started.  Patient did not have stridor and wheezing.     Vital Signs:  Temperature: 36.9C to 36.8C; Blood Pressure: 135/59 mmHg to 145/3mmHg; Pulse: 64 to 70; Respirations: 15 to 22; O2Sat: 97% to 95% on Room Air.  Patient was treated with diphenhydramine and symptoms resolved quickly.  Blood bank investigation for hemolysis was negative.    Physician Interpretation:   Allergic Reaction.    Severity: Non-severe    Imputability: Definite    Recommendation:   (1) Resumption of current transfusion is acceptable if symptoms resolved quickly.   (2) Premedication with antihistamine 45 minutes to 1 hour before future transfusion may be beneficial.               ? Common bile duct dilatation 10/31/2019   ? Left rotator cuff tear 01/26/2017   ? Biventricular automatic implantable cardioverter defibrillator in situ 07/14/2016   ? Coronary artery disease involving native coronary artery of native heart without angina pectoris 12/08/2014     1. 17/07 pharmacologic stress study: Mildly abnormal with slight degree of ischemia in mid to distal inferolateral wall in circumflex distribution. EF 49%, although visually appeared to be 55%. EDV 87 mL.  2. 02/14/05 cardiac catheterization: LAD proximal 40-50% stenosis, proximal mid portion 60% followed by 70-75% stenosis between the first and second diagonal branches. Second diagonal branch 60% ostial stenosis. Circumflex first OM with proximal 50-60% stenosis. RCA 20-30% luminal irregularity in the proximal mid segment. Normal end-diastolic pressure 6-8 mmHg. Left ventriculogram not performed.  3. 05/08/05 coronary angiogram: Left main free of significant disease, LAD proximal 30-40% stenosis followed by 70% mid stenosis between the first and second diagonal branches, 50% stenosis in the LAD at bifurcation or second diagonal branch. Second diagonal branch ostial 60% stenosis. Left circumflex no significant disease. Large obtuse marginal branch proximal 40% stenosis. Right coronary artery 20% luminal narrowing in mid RCA. FFR proximal-mid LAD at baseline 0.90, at maximal hyperemia 0.72. FFR of first obtuse marginal 0.97 at rest, decreased to 0.91 with maximum hyperemia with adenosine.? Status post PCI and stenting of proximal to mid left anterior descending artery with 3.0- x 30.0-mm Driver bare-metal stent.  4. 05/11/2006, exercise thallium.? The patient exercised for 8:33 minutes,achieving 89 percent of MPHR.? Stress thallium negative for ischemia.? His stress ECG negative for ischemia.? EF 59 %.  5. 06/08/08: [facility] stress thallium: No significant myocardial ischemia. There is diaphragmatic attenuation/and due to hot spot artifact involving the inferior wall. EF 66%  6. 07/18/11: Probably abnormal and demonstrates a relatively small, reversible anteroseptal and apical septal defect. There is also a fixed, true apical component noted. This may suggest limited ischemia in an LAD distribution. EF 53%  7. 08/03/11: Cardiac Cath: Moderate 70% lesion right after the previously placed stent in the proximal LAD.FFR dropped from 0.86 to 0.67. Moderate 40 percent to 50 percent lesion in the large first obtuse marginal vessel, mild 30 percent lesion in the proximal to mid RCA.LAD- stented using a bare-metal stent, Integrity 3.0 x 12 mm and followed by another stent of 2.5 x 12 mm in overlapping fashion.  8. 08/25/11 Echo:? EF 40-45%.? Mild left atrial enlargement.? Mild concentric LVH.? Calcific thickening of noncoronary cusp of aortic valve with no stenosis or ?regurgitation.?  PAP=34 mmHg.  9. 06/09/13 Regadenoson Thallium:? EF 42%.? No definite perfusion defects.  10. 09/18/2016: Reg stress thall: EF 55%. probably normal with no evidence of significant myocardial ischemia. The mild fixed reduction in inferolateral/apical lateral tracer activity is probably related to diaphragmatic/soft tissue attenuation     ? Chronic anticoagulation 12/08/2014   ? 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 08/25/11: Echo:EF=40- 45%. Mild left atrial enlargement. Mild LVH. Calcific thickening of noncoronary cusp of aortic valve. No stenosis or regurgitation. PAP 34  2. 10/06/12 Echo:  EF 45%.  LA mildly enlarged.  3. 10/06/12: Echo: LV EF is about 45%. The LA is mildly enlarged. Mild MR/TR  4. 12/08/14 Echo:  EF 35%. Sclerotic AV with not stenosis or regurgitation.  Mildly dilated sinuses of valsalva -3.7 cm. PAP=27 mmHg.  5. 01/02/2015 CRT-D implant  6. 07/19/15 2D Echo:  EF 40%.  Sclerotic AV.  Mildly dilated sinuses al valsalva (3.9 cm)  7. 08/13/18: Echo: EF 55%.Interventricular septal motion consistent with pacing. Grade I diastolic dysfunction. No significant valvular abnormality. PASP 26.         ? Abnormal cardiovascular stress test 07/31/2011   ? Abnormal thallium stress test 07/31/2011   ? Paroxysmal atrial fibrillation (HCC) 02/22/2011     01/2011: TEE: EF 40-45%. Moderate left atrial enlargement. Mild right atrial enlargement. Mild MR/TR. No thrombus is noted in the left atrial appendage or the left atrium.   02/21/11: Cardioversion: 200 J biphasic  INR followed by PCP, Lynne Leader MD - as of 08/05/11  11/06/14 Holter Monitor:  46 hour Holter monitor demonstrates sinus rhythm with rare premature atrial complex beats (21 in 46 hours) and rare premature ventricular     complex beats (29 in 46 hours).? No episode of significant atrial or ventricular sustained arrhythmia.? No symptoms reported.  ?     ? LBBB (left bundle branch block) 06/05/2009     05/2008: rate related during stress test and noted again on 06/01/09 at rate of 54 bpm     ? Essential hypertension 06/07/2008   ? 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 HLD (hyperlipidemia) 06/07/2008         Review of Systems   All other systems reviewed and are negative.      Physical Exam  Patient is awake and alert in no acute distress.  He is accompanied by his son  Pupils react without scleral injection  Neck shows excessive skin below the chin with difficult to visualize landmarks  No findings for bruits or significant jugular venous abnormalities  Lungs are clear to auscultation  Heart S1, S2 normal.  No murmurs, clicks, or gallops  Abdomen is protuberant but soft  Pulses are 1-2+ bilaterally radial as well as pedal locations with minimal edema at the ankles and    Cardiovascular Studies      Cardiovascular Health Factors  Vitals BP Readings from Last 3 Encounters:   02/18/21 132/84   12/26/20 (!) 170/90   09/27/20 122/70     Wt Readings from Last 3 Encounters:   02/18/21 95.3 kg (210 lb)   12/26/20 98.4 kg (217 lb)   10/01/20 105.2 kg (232 lb)     BMI Readings from Last 3 Encounters:   02/18/21 30.13 kg/m?   12/26/20 32.05 kg/m?   10/01/20 35.28 kg/m?      Smoking Social History     Tobacco Use  Smoking Status Never   Smokeless Tobacco Never      Lipid Profile Cholesterol   Date Value Ref Range Status   10/31/2019 95 <200 MG/DL Final     HDL   Date Value Ref Range Status   10/31/2019 22 (L) >40 MG/DL Final     LDL   Date Value Ref Range Status   10/31/2019 47 <100 mg/dL Final     Triglycerides   Date Value Ref Range Status   10/31/2019 136 <150 MG/DL Final      Blood Sugar Hemoglobin A1C   Date Value Ref Range Status   10/31/2019 6.8 (H) 4.0 - 6.0 % Final     Comment:     The ADA recommends that most patients with type 1 and type 2 diabetes maintain   an A1c level <7%.       Glucose   Date Value Ref Range Status   09/21/2020 149 (H)  Final   11/07/2019 115 (H) 70 - 100 MG/DL Final   16/10/9602 540 (H) 70 - 100 MG/DL Final   98/11/9145 829 (H) 70 - 110 MG/DL Final   56/21/3086 578 (H) 70 - 110 MG/DL Final     Glucose, POC   Date Value Ref Range Status   02/21/2011 160 (H) 70 - 100 MG/DL Final   46/96/2952 841 (H) 70 - 100 MG/DL Final   32/44/0102 725 (H) 70 - 100 MG/DL Final          Problems Addressed Today  Encounter Diagnoses   Name Primary?   ? Biventricular automatic implantable cardioverter defibrillator in situ Yes   ? Paroxysmal atrial fibrillation (HCC)    ? LBBB (left bundle branch block)    ? Ischemic cardiomyopathy    ? Hypertension, unspecified type        Assessment and Plan     Symptomatic episodic hypertension.  No findings for unstable rhythm or coronary artery disease.  No findings for heart failure or significant electrolyte or renal changes.  He does have risk factors and findings suggestive of sleep apnea.  I am concerned that while the clonidine has been effective in dropping his blood pressure that he has rebound hypertension that could be more problematic.  We will continue on his current medications with the addition of hydralazine 25 mg p.o. twice daily.  We will try to avoid use of the clonidine is much as able.  We will arrange for home sleep apnea study.  He can contact my office any questions or concerns.  Would like to reassess him in the clinic in approximate 4 to 6 weeks.         Current Medications (including today's revisions)  ? aspirin EC 81 mg tablet Take 1 Tab by mouth daily.   ? atorvastatin (LIPITOR) 40 mg tablet Take one tablet by mouth daily.   ? cloNIDine HCL (CATAPRES) 0.1 mg tablet Take 0.1 mg by mouth twice daily as needed.   ? colestipoL (COLESTID) 1 gram tablet Take 1 g by mouth daily.   ? finasteride (PROSCAR) 5 mg tablet Take 5 mg by mouth daily.   ? hydroCHLOROthiazide (HYDRODIURIL) 25 mg tablet TAKE ONE TABLET BY MOUTH EVERY MORNING.   ? HYDROcodone/acetaminophen (NORCO) 5/325 mg tablet Take 1-2 Tabs by mouth every 4 hours as needed for Pain (Patient taking differently: Take 1-2 tablets by mouth every 4 hours as needed for Pain.  PRN USE (medication a few years old and still uses if needed but does  not like how it makes him feel))   ? losartan (COZAAR) 100 mg tablet Take one tablet by mouth daily.   ? metoprolol XL (TOPROL XL) 200 mg extended release tablet Take one tablet by mouth daily.   ? nitroglycerin (NITROSTAT) 0.4 mg tablet PLACE 1 TABLET UNDER TONGUE EVERY 5 MINUTES AS NEEDED FOR CHEST PAIN. MAY REPEAT FOR 3 DOSES   ? omeprazole DR (PRILOSEC) 20 mg capsule Take 20 mg by mouth daily before breakfast.   ? tamsulosin (FLOMAX) 0.4 mg capsule Take 1 Cap by mouth Daily.   ? VIT C/VIT E ACETATE/LUTEIN/MIN (OCUVITE LUTEIN PO) Take 1 Tab by mouth daily.   ? warfarin (COUMADIN) 5 mg tablet Take 1 Tab by mouth at bedtime daily. as directed based on INR.

## 2021-02-18 NOTE — Patient Instructions
Start Hydralazine 25 mg twice daily  Will arrange for home sleep study  Call with any concerns  Return visit in 4-6 weeks

## 2021-03-08 ENCOUNTER — Encounter: Admit: 2021-03-08 | Discharge: 2021-03-08 | Payer: MEDICARE

## 2021-03-08 NOTE — Telephone Encounter
Hypotension    Current blood pressure: 83/41  Current heart rate: 69  Baseline BP/HR: 130s/60s HR- 60s  Associated symptoms: None  Medication Information: Recent medication changes? When and what? Taking medications as prescribed? Have you already taken them this morning/afternoon? PRNs available?     - Recent med changes: Hydralazine 25mg  BID started on 02/18/21. BP has been trending low since then. Took this morning.     Additional relevant information: Yesterday BP wsa 89/54 with a HR of 63. Today at 9am BP was 132/61 HR of 65 then one hour later after taking the hydralazine 25mg  it was 83/41 HR 69. Reports yesterday he drank as much water as he could to get it back to normal but that it took some time.     Care Advice: discussed with patient to stop the hydralazine 25mg  BID for one week but to continue to watch his BPs and HRs for the whole week and to call 02/20/21 back to update . He understands to call back sooner should his BP starting to creep up or if his BP continues to be low. No further questions. He was agreeable to this plan and preferred to not take the hydralazine. Patient verbalized understanding.    Disposition: See care advice.

## 2021-04-09 ENCOUNTER — Encounter: Admit: 2021-04-09 | Discharge: 2021-04-09 | Payer: MEDICARE

## 2021-04-09 NOTE — Telephone Encounter
I returned a phone call to [place]. He called inquiring how to get in contact to schedule his sleep study. I provided him sleep lab phone number to call for further assistance. All questions answered.

## 2021-05-01 ENCOUNTER — Encounter: Admit: 2021-05-01 | Discharge: 2021-05-01 | Payer: MEDICARE

## 2021-05-01 DIAGNOSIS — G479 Sleep disorder, unspecified: Secondary | ICD-10-CM

## 2021-05-01 NOTE — Progress Notes
Patient does not need a follow up with a Sleep provider at this time.

## 2021-05-02 ENCOUNTER — Encounter: Admit: 2021-05-02 | Discharge: 2021-05-02 | Payer: MEDICARE

## 2021-05-03 ENCOUNTER — Encounter: Admit: 2021-05-03 | Discharge: 2021-05-03 | Payer: MEDICARE

## 2021-05-03 NOTE — Telephone Encounter
Patient given results and recommendations as below.

## 2021-05-03 NOTE — Telephone Encounter
-----   Message from Levora Angel, MD sent at 05/03/2021  8:35 AM CDT -----  Let him know his sleep study was fine. Thank you  ----- Message -----  From: Remus Loffler, MA  Sent: 05/02/2021   2:43 PM CDT  To: Levora Angel, MD

## 2021-05-27 ENCOUNTER — Encounter: Admit: 2021-05-27 | Discharge: 2021-05-27 | Payer: MEDICARE

## 2021-05-27 ENCOUNTER — Ambulatory Visit: Admit: 2021-05-27 | Discharge: 2021-05-28 | Payer: MEDICARE

## 2021-05-27 DIAGNOSIS — Z95810 Presence of automatic (implantable) cardiac defibrillator: Secondary | ICD-10-CM

## 2021-05-27 DIAGNOSIS — I447 Left bundle-branch block, unspecified: Secondary | ICD-10-CM

## 2021-05-27 DIAGNOSIS — I428 Other cardiomyopathies: Secondary | ICD-10-CM

## 2021-05-27 DIAGNOSIS — I10 Essential (primary) hypertension: Secondary | ICD-10-CM

## 2021-05-27 DIAGNOSIS — I2510 Atherosclerotic heart disease of native coronary artery without angina pectoris: Secondary | ICD-10-CM

## 2021-05-27 DIAGNOSIS — Z8679 Personal history of other diseases of the circulatory system: Secondary | ICD-10-CM

## 2021-05-27 DIAGNOSIS — Z7901 Long term (current) use of anticoagulants: Secondary | ICD-10-CM

## 2021-05-27 DIAGNOSIS — Z905 Acquired absence of kidney: Secondary | ICD-10-CM

## 2021-05-27 DIAGNOSIS — E785 Hyperlipidemia, unspecified: Secondary | ICD-10-CM

## 2021-05-27 DIAGNOSIS — T8092XA Unspecified transfusion reaction, initial encounter: Secondary | ICD-10-CM

## 2021-05-27 MED ORDER — METOPROLOL SUCCINATE 200 MG PO TB24
100 mg | ORAL_TABLET | Freq: Two times a day (BID) | ORAL | 3 refills | 90.00000 days | Status: AC
Start: 2021-05-27 — End: ?

## 2021-05-27 MED ORDER — LOSARTAN 100 MG PO TAB
50 mg | ORAL_TABLET | Freq: Two times a day (BID) | ORAL | 3 refills | 90.00000 days | Status: AC
Start: 2021-05-27 — End: ?

## 2021-05-27 NOTE — Progress Notes
Date of Service: 05/27/2021    Justin Farmer is a 81 y.o. male.       HPI     Patient is an 81 year old Caucasian male past medical history of ischemic cardiomyopathy, chronic left bundle branch block with previous diminished left ventricular ejection fraction.  He has a biventricular ICD in place and without device left ventricular ejection fraction has really turned to the low normal range.  He does not struggle with hypervolemia, change in activity tolerance, or symptoms specific to heart failure he has had episodes of paroxysmal atrial fibrillation identified on his device previously, this is not been a recent recurrent issue or otherwise symptomatic.  He was struggling with malaise and nighttime headaches with labile blood pressure findings.  When I saw him in January blood pressures were relatively low in the a.m. been increased at night.  When we put him on the hydralazine blood pressures typically start to run too low.  He has been able to avoid the as needed clonidine.  Currently with changing his metoprolol to nighttime seems like his blood pressures are better regulated.  Blood pressure usually systolic 120 or little bit lower when he first gets up in the morning, stays fairly regulated throughout the day and then will hit 150-160 in the evening.  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         Vitals:    05/27/21 1104 05/27/21 1123   BP: 138/70    BP Source: Arm, Right Upper    Pulse: 61    SpO2: 97%    O2 Device: None (Room air) None (Room air)   PainSc: Zero    Weight: 98.4 kg (217 lb)    Height: 177.8 cm (5' 10)      Body mass index is 31.14 kg/m?Marland Kitchen     Past Medical History  Patient Active Problem List    Diagnosis Date Noted   ? Other cardiomyopathies (HCC) 09/27/2020   ? Melena 11/05/2019   ? Transfusion reaction 11/01/2019       Allergic Transfusion Reaction    NAME:Justin Farmer MRN: [id number]             DOB:Sep 20, 1940          AGE: 81 y.o.  ADMISSION DATE: 10/30/2019             DAYS ADMITTED: LOS: 1 day    Justin Farmer is 81 y.o. male with history of INR 3.1 with obstructive jaundice secondary to choledocholithiasis on warfarin for A Fib who received 223 mL of plasma with no premedication.  Patient developed rash, pruritus and hives on arm and torso 60 minutes after the transfusion started.  Patient did not have stridor and wheezing.     Vital Signs:  Temperature: 36.9C to 36.8C; Blood Pressure: 135/59 mmHg to 145/33mmHg; Pulse: 64 to 70; Respirations: 15 to 22; O2Sat: 97% to 95% on Room Air.  Patient was treated with diphenhydramine and symptoms resolved quickly.  Blood bank investigation for hemolysis was negative.    Physician Interpretation:   Allergic Reaction.    Severity: Non-severe    Imputability: Definite    Recommendation:   (1) Resumption of current transfusion is acceptable if symptoms resolved quickly.   (2) Premedication with antihistamine 45 minutes to 1 hour before future transfusion may be beneficial.               ?  Common bile duct dilatation 10/31/2019   ? Left rotator cuff tear 01/26/2017   ? Biventricular automatic implantable cardioverter defibrillator in situ 07/14/2016   ? Coronary artery disease involving native coronary artery of native heart without angina pectoris 12/08/2014     1. 17/07 pharmacologic stress study: Mildly abnormal with slight degree of ischemia in mid to distal inferolateral wall in circumflex distribution. EF 49%, although visually appeared to be 55%. EDV 87 mL.  2. 02/14/05 cardiac catheterization: LAD proximal 40-50% stenosis, proximal mid portion 60% followed by 70-75% stenosis between the first and second diagonal branches. Second diagonal branch 60% ostial stenosis. Circumflex first OM with proximal 50-60% stenosis. RCA 20-30% luminal irregularity in the proximal mid segment. Normal end-diastolic pressure 6-8 mmHg. Left ventriculogram not performed.  3. 05/08/05 coronary angiogram: Left main free of significant disease, LAD proximal 30-40% stenosis followed by 70% mid stenosis between the first and second diagonal branches, 50% stenosis in the LAD at bifurcation or second diagonal branch. Second diagonal branch ostial 60% stenosis. Left circumflex no significant disease. Large obtuse marginal branch proximal 40% stenosis. Right coronary artery 20% luminal narrowing in mid RCA. FFR proximal-mid LAD at baseline 0.90, at maximal hyperemia 0.72. FFR of first obtuse marginal 0.97 at rest, decreased to 0.91 with maximum hyperemia with adenosine.? Status post PCI and stenting of proximal to mid left anterior descending artery with 3.0- x 30.0-mm Driver bare-metal stent.  4. 05/11/2006, exercise thallium.? The patient exercised for 8:33 minutes,achieving 89 percent of MPHR.? Stress thallium negative for ischemia.? His stress ECG negative for ischemia.? EF 59 %.  5. 06/08/08: [facility]icon stress thallium: No significant myocardial ischemia. There is diaphragmatic attenuation/and due to hot spot artifact involving the inferior wall. EF 66%  6. 07/18/11: Probably abnormal and demonstrates a relatively small, reversible anteroseptal and apical septal defect. There is also a fixed, true apical component noted. This may suggest limited ischemia in an LAD distribution. EF 53%  7. 08/03/11: Cardiac Cath: Moderate 70% lesion right after the previously placed stent in the proximal LAD.FFR dropped from 0.86 to 0.67. Moderate 40 percent to 50 percent lesion in the large first obtuse marginal vessel, mild 30 percent lesion in the proximal to mid RCA.LAD- stented using a bare-metal stent, Integrity 3.0 x 12 mm and followed by another stent of 2.5 x 12 mm in overlapping fashion.  8. 08/25/11 Echo:? EF 40-45%.? Mild left atrial enlargement.? Mild concentric LVH.? Calcific thickening of noncoronary cusp of aortic valve with no stenosis or ?regurgitation.? PAP=34 mmHg.  9. 06/09/13 Regadenoson Thallium:? EF 42%.? No definite perfusion defects.  10. 09/18/2016: Reg stress thall: EF 55%. probably normal with no evidence of significant myocardial ischemia. The mild fixed reduction in inferolateral/apical lateral tracer activity is probably related to diaphragmatic/soft tissue attenuation     ? Chronic anticoagulation 12/08/2014   ? 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 08/25/11: Echo:EF=40- 45%. Mild left atrial enlargement. Mild LVH. Calcific thickening of noncoronary cusp of aortic valve. No stenosis or regurgitation. PAP 34  2. 10/06/12 Echo:  EF 45%.  LA mildly enlarged.  3. 10/06/12: Echo: LV EF is about 45%. The LA is mildly enlarged. Mild MR/TR  4. 12/08/14 Echo:  EF 35%. Sclerotic AV with not stenosis or regurgitation.  Mildly dilated sinuses of valsalva -3.7 cm. PAP=27 mmHg.  5. 01/02/2015 CRT-D implant  6. 07/19/15 2D Echo:  EF 40%.  Sclerotic AV.  Mildly dilated sinuses al valsalva (3.9 cm)  7. 08/13/18: Echo: EF 55%.Interventricular septal motion  consistent with pacing. Grade I diastolic dysfunction. No significant valvular abnormality. PASP 26.         ? Abnormal cardiovascular stress test 07/31/2011   ? Abnormal thallium stress test 07/31/2011   ? Paroxysmal atrial fibrillation (HCC) 02/22/2011     01/2011: TEE: EF 40-45%. Moderate left atrial enlargement. Mild right atrial enlargement. Mild MR/TR. No thrombus is noted in the left atrial appendage or the left atrium.   02/21/11: Cardioversion: 200 J biphasic  INR followed by PCP, Lynne Leader MD - as of 08/05/11  11/06/14 Holter Monitor:  46 hour Holter monitor demonstrates sinus rhythm with rare premature atrial complex beats (21 in 46 hours) and rare premature ventricular     complex beats (29 in 46 hours).? No episode of significant atrial or ventricular sustained arrhythmia.? No symptoms reported.  ?     ? LBBB (left bundle branch block) 06/05/2009     05/2008: rate related during stress test and noted again on 06/01/09 at rate of 54 bpm     ? Essential hypertension 06/07/2008   ? 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 HLD (hyperlipidemia) 06/07/2008         Review of Systems   Constitutional: Negative.   HENT: Negative.    Eyes: Negative.    Cardiovascular: Negative.    Respiratory: Negative.    Endocrine: Negative.    Hematologic/Lymphatic: Negative.    Skin: Negative.    Musculoskeletal: Negative.    Gastrointestinal: Negative.    Genitourinary: Negative.    Neurological: Negative.    Psychiatric/Behavioral: Negative.    Allergic/Immunologic: Negative.        Physical Exam  Awake and alert in no acute distress, pleasant elderly gentleman.  Accompanied by his son  Pupils are equal round, with no scleral injection chest is symmetric with well-healed and stable placement of the battery pack at the left upper chest.  Lungs are clear to auscultation with good effort  Neck is supple with normal carotid upstroke and no bruits.  No significant jugular venous abnormalities  Heart S1, S2 that are normal.  No significant murmurs, clicks, or gallops  Abdomen mildly protuberant but soft  Pulses are 2+ and symmetric bilaterally at radial as well as dorsalis pedis locations  No significant peripheral edema    Cardiovascular Studies      Cardiovascular Health Factors  Vitals BP Readings from Last 3 Encounters:   05/27/21 138/70   02/18/21 132/84   12/26/20 (!) 170/90     Wt Readings from Last 3 Encounters:   05/27/21 98.4 kg (217 lb)   02/18/21 95.3 kg (210 lb)   12/26/20 98.4 kg (217 lb)     BMI Readings from Last 3 Encounters:   05/27/21 31.14 kg/m?   02/18/21 30.13 kg/m?   12/26/20 32.05 kg/m?      Smoking Social History     Tobacco Use   Smoking Status Never   Smokeless Tobacco Never      Lipid Profile Cholesterol   Date Value Ref Range Status   10/31/2019 95 <200 MG/DL Final     HDL   Date Value Ref Range Status   10/31/2019 22 (L) >40 MG/DL Final     LDL   Date Value Ref Range Status   10/31/2019 47 <100 mg/dL Final     Triglycerides   Date Value Ref Range Status   10/31/2019 136 <150 MG/DL Final  Blood Sugar Hemoglobin A1C   Date Value Ref Range Status   10/31/2019 6.8 (H) 4.0 - 6.0 % Final     Comment:     The ADA recommends that most patients with type 1 and type 2 diabetes maintain   an A1c level <7%.       Glucose   Date Value Ref Range Status   09/21/2020 149 (H)  Final   11/07/2019 115 (H) 70 - 100 MG/DL Final   45/40/9811 914 (H) 70 - 100 MG/DL Final   78/29/5621 308 (H) 70 - 110 MG/DL Final   65/78/4696 295 (H) 70 - 110 MG/DL Final     Glucose, POC   Date Value Ref Range Status   02/21/2011 160 (H) 70 - 100 MG/DL Final   28/41/3244 010 (H) 70 - 100 MG/DL Final   27/25/3664 403 (H) 70 - 100 MG/DL Final          Problems Addressed Today  Encounter Diagnoses   Name Primary?   ? Biventricular automatic implantable cardioverter defibrillator in situ Yes   ? LBBB (left bundle branch block)    ? Essential hypertension    ? Other cardiomyopathies (HCC)        Assessment and Plan     Cardiac rhythm, device function are normal.  No findings for heart failure or significant changes in volume status.  Blood pressure has been more regulated but still suboptimal.  In discussion with the patient it sounds like the 200 mg of the Toprol at once somewhat overwhelms the system.  We will continue with the Toprol-XL at 100 mg p.o. twice daily, we will also change his losartan to 50 mg p.o. twice daily.  Had some recent issues with hyponatremia.  In discussion with the patient sounds like he is drinking upwards of 80 ounces of fluid predominantly water during the day.  Talked about fluid control and caution that can provoke hyponatremia and really not further improved renal function.  We will have him go back on his hydrochlorothiazide to 25 mg p.o. daily.  When he has his blood work checked with his primary physician would ask him to relay that information to [place].  He can contact us with any questions or concerns.  We will continue routine monitoring of his device.  Follow-up in clinical be in 3 to 4 months.         Current Medications (including today's revisions)  ? aspirin EC 81 mg tablet Take 1 Tab by mouth daily.   ? atorvastatin (LIPITOR) 40 mg tablet Take one tablet by mouth daily.   ? colestipoL (COLESTID) 1 gram tablet Take one tablet by mouth daily.   ? finasteride (PROSCAR) 5 mg tablet Take one tablet by mouth daily.   ? hydroCHLOROthiazide (HYDRODIURIL) 25 mg tablet TAKE ONE TABLET BY MOUTH EVERY MORNING.   ? HYDROcodone/acetaminophen (NORCO) 5/325 mg tablet Take 1-2 Tabs by mouth every 4 hours as needed for Pain (Patient taking differently: Take one tablet to two tablets by mouth every 4 hours as needed for Pain.  PRN USE (medication a few years old and still uses if needed but does not like how it makes him feel))   ? losartan (COZAAR) 100 mg tablet Take one-half tablet by mouth twice daily.   ? metoprolol XL (TOPROL XL) 200 mg extended release tablet Take one-half tablet by mouth twice daily.   ? nitroglycerin (NITROSTAT) 0.4 mg tablet PLACE 1 TABLET UNDER TONGUE EVERY 5 MINUTES AS NEEDED  FOR CHEST PAIN. MAY REPEAT FOR 3 DOSES   ? omeprazole DR (PRILOSEC) 20 mg capsule Take one capsule by mouth daily before breakfast.   ? tamsulosin (FLOMAX) 0.4 mg capsule Take one capsule by mouth daily.   ? VIT C/VIT E ACETATE/LUTEIN/MIN (OCUVITE LUTEIN PO) Take 1 Tab by mouth daily.   ? warfarin (COUMADIN) 5 mg tablet Take 1 Tab by mouth at bedtime daily. as directed based on INR.

## 2021-05-27 NOTE — Patient Instructions
Take Metoprolol xl 1/2 tablet twice daily  Take Losartan 1/2 tablet twice daily  Take hydrochlorathiazide 1 tablet in am  Drink when thirsty, can drink excessive water that we have to monitor  Return visit in 3-4 months  Have results from outside lab sent here

## 2021-06-04 ENCOUNTER — Encounter: Admit: 2021-06-04 | Discharge: 2021-06-04 | Payer: MEDICARE

## 2021-06-05 ENCOUNTER — Encounter: Admit: 2021-06-05 | Discharge: 2021-06-05 | Payer: MEDICARE

## 2021-06-05 NOTE — Telephone Encounter
Called and reviewed plan of care with Justin Farmer. He verbalized understanding. He will continue to split his pills at this time and just take 1/2 tablet in the morning. I encouraged him to continue to keep an eye on blood pressure and heart rate and let us know if there are any concerns. All questions answered.

## 2021-06-05 NOTE — Telephone Encounter
Justin Farmer, Justin Homer, MD  You 15 minutes ago (3:08 PM)      Decrease his toprol xl to 100mg  each morning, do not take at night.

## 2021-06-05 NOTE — Telephone Encounter
-----   Message from Levora Angel, MD sent at 06/05/2021  9:05 AM CDT -----  Findings on his blood test are in a good range.  ----- Message -----  From: Agapito Games, RN  Sent: 06/04/2021  11:54 AM CDT  To: Levora Angel, MD

## 2021-06-05 NOTE — Telephone Encounter
I called and spoke to Justin Farmer about recent lab results. He verbalized understanding of the results. He is still having concerns about his blood pressure at home. He states when he gets up in the AM before his medications, his blood pressure is about 120/60. He then states he will take the 1/2 tablet of Metoprolol and Losartan and it drops his blood pressure systolic into the 70-80's. He believes this is a daily occurrence. He reports fatigue and lightheadedness when this happens. BP will slowly trend back up during the afternoon. He asked I follow up with Dr. Harvel Ricks about this. Will follow up. All questions answered during the call.

## 2021-06-10 ENCOUNTER — Encounter: Admit: 2021-06-10 | Discharge: 2021-06-10 | Payer: MEDICARE

## 2021-06-10 MED ORDER — ATORVASTATIN 40 MG PO TAB
40 mg | ORAL_TABLET | Freq: Every day | ORAL | 3 refills | Status: AC
Start: 2021-06-10 — End: ?

## 2021-06-23 ENCOUNTER — Encounter: Admit: 2021-06-23 | Discharge: 2021-06-22 | Payer: MEDICARE

## 2021-07-02 ENCOUNTER — Encounter: Admit: 2021-07-02 | Discharge: 2021-07-02 | Payer: MEDICARE

## 2021-07-03 ENCOUNTER — Encounter: Admit: 2021-07-03 | Discharge: 2021-07-03 | Payer: MEDICARE

## 2021-07-03 NOTE — Telephone Encounter
Tachycardia    Onset: Woke up this morning and took blood pressure and heart as he normally does. HR was 126 and BP 156/81.   Description: New onset as of this morning. Reports last 5 days HR has been in the 70's when checked.   HR and BP: HR: 126, BP: 156/81  Related symptoms: Mild shortness of breath. Denies chest pain.   Additional relevant information: HX of PAF. Took his morning dose of Metoprolol 100mg  before calling the office.     Care Advice: Deen Deguia he should proceed to ER near home for ECG at this time to r/o A-fib or other irregular rhythm. He feels safe to drive at this time. Patient verbalized understanding. Will go to [facility] in [place], ANGERED. Instructed to follow up with our office depending on ER visit.     Disposition: Go to Emergency Department Now

## 2021-07-04 ENCOUNTER — Encounter: Admit: 2021-07-04 | Discharge: 2021-07-04 | Payer: MEDICARE

## 2021-07-04 NOTE — Telephone Encounter
Pt. currently admitted to [facility]. We will follow up with pt. once discharged.

## 2021-07-04 NOTE — Telephone Encounter
Hoos-Thompson, Justin Homer, MD  P Cvm Nurse Gen Card Team Green  By his device check it appears he is in atrial fibrillation. CVs have any symptoms for the arrhythmia. I would like to have him get a ECG to evaluate further. Thank you

## 2021-07-04 NOTE — Telephone Encounter
Justin Farmer CW w/ [facility] requesting appt within next wk post d/c for Afib w/RVR. Scheduled for 2 pm 6/22 with CG at OP location. States will relay info to pt upon d/c.

## 2021-07-31 ENCOUNTER — Encounter: Admit: 2021-07-31 | Discharge: 2021-07-31 | Payer: MEDICARE

## 2021-07-31 MED ORDER — LOSARTAN 50 MG PO TAB
50 mg | ORAL_TABLET | Freq: Two times a day (BID) | ORAL | 3 refills | 90.00000 days | Status: AC
Start: 2021-07-31 — End: ?

## 2021-08-06 ENCOUNTER — Encounter: Admit: 2021-08-06 | Discharge: 2021-08-06 | Payer: MEDICARE

## 2021-08-06 DIAGNOSIS — Z95810 Presence of automatic (implantable) cardiac defibrillator: Secondary | ICD-10-CM

## 2021-08-06 DIAGNOSIS — I447 Left bundle-branch block, unspecified: Secondary | ICD-10-CM

## 2021-08-08 ENCOUNTER — Encounter: Admit: 2021-08-08 | Discharge: 2021-08-08 | Payer: MEDICARE

## 2021-08-08 ENCOUNTER — Ambulatory Visit: Admit: 2021-08-08 | Discharge: 2021-08-08 | Payer: MEDICARE

## 2021-08-08 DIAGNOSIS — Z8679 Personal history of other diseases of the circulatory system: Secondary | ICD-10-CM

## 2021-08-08 DIAGNOSIS — I447 Left bundle-branch block, unspecified: Secondary | ICD-10-CM

## 2021-08-08 DIAGNOSIS — E785 Hyperlipidemia, unspecified: Secondary | ICD-10-CM

## 2021-08-08 DIAGNOSIS — I480 Paroxysmal atrial fibrillation: Secondary | ICD-10-CM

## 2021-08-08 DIAGNOSIS — I2510 Atherosclerotic heart disease of native coronary artery without angina pectoris: Secondary | ICD-10-CM

## 2021-08-08 DIAGNOSIS — T8092XA Unspecified transfusion reaction, initial encounter: Secondary | ICD-10-CM

## 2021-08-08 DIAGNOSIS — I10 Essential (primary) hypertension: Secondary | ICD-10-CM

## 2021-08-08 DIAGNOSIS — Z7901 Long term (current) use of anticoagulants: Secondary | ICD-10-CM

## 2021-08-08 DIAGNOSIS — Z136 Encounter for screening for cardiovascular disorders: Secondary | ICD-10-CM

## 2021-08-08 DIAGNOSIS — E7800 Pure hypercholesterolemia, unspecified: Secondary | ICD-10-CM

## 2021-08-08 DIAGNOSIS — Z905 Acquired absence of kidney: Secondary | ICD-10-CM

## 2021-08-08 NOTE — Patient Instructions
It was nice to meet you in clinic today.    We discussed:  Cholesterol  Blood work and lasix without potassium and need for replacement     I would like to make the following medication adjustments:  None at this time     Otherwise continue the same medications as you have been doing.    We will be pursuing the following tests after your appointment today:  Labs to include BMP  Lipid profile     I will plan to see you back in about 3 months.  Please call us in the meantime with any questions or concerns.    The Cardiovascular Medicine "Green Team" nurse Ronaldo Miyamoto Annia Belt [place], or Agapito Games) number is [phone].  If you have not heard the results of your testing in more than 1 week after it has been performed, please give [place] a call so that we may investigate further.  If you need to schedule an appointment, the "green team" scheduling number is [phone].    NOTE: MyChart messages and phone calls received on weekends, on holidays, and after 4 pm on weekdays will NOT be seen until the following business day. If you have an urgent matter during these times, please call [phone] to reach the on-call team.     If you need prescription refills, please contact your pharmacy.     Call [phone] for scheduling.     Maryella Shivers, APRN

## 2021-08-08 NOTE — Progress Notes
Date of Service: 08/08/2021    Justin Farmer is a 81 y.o. male.       HPI     Justin Farmer was seen in the cardiology clinic for his hospital follow up.  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 Harvel Ricks.     He was recently hospitalized at [facility]  in [place], [place] on 07/03/21 for atrial fibrillation with rapid ventricular response.  Prior to being hospitalized he was already anticoagulated with warfarin.  Patient states that last Friday he was feeling palpitations at his house and noticed that his heart rate was above 120.  He stated that the palpitations resolved after 3 hours and he woke up again with palpitations and was noted to be intrafibrillation with heart rates in the 140s and he was given Cardizem IV push and started on a Cardizem drip given metoprolol.His HCTZ and Metoprolol succinate 200mg  were discontinued and he was started on metoprolol succinate 100mg  BID, and Lasix 40mg  daily.     He was also seen fall 2022 for chest discomfort and had a regadenoson thallium stress test that showed EF of 55% and there was no evidence of myocardial ischemia and was thought to be an overall low restudy.    He has been taking his Metoprolol 100mg  twice daily, doing it this way does not wipe him out. His sodium was low and this was why his HCTZ was discontinued and he is now on Lasix. He feels better since he was hospitalized. His preliminary ECG in office today shows that he is AV paced 60bpm.  He has not had a BMP rechecked since his hospitalization and is not on a potassium supplement with his lasix. He denies any chest discomfort, dizziness, palpitations or shortness of breath.         Vitals:    08/08/21 1334   BP: 118/70   BP Source: Arm, Left Upper   Pulse: 60   Weight: 97.5 kg (215 lb)   Height: 175.3 cm (5' 9)     Body mass index is 31.75 kg/m?Marland Kitchen     Past Medical History  Patient Active Problem List    Diagnosis Date Noted   ? Other cardiomyopathies (HCC) 09/27/2020   ? Melena 11/05/2019   ? Transfusion reaction 11/01/2019       Allergic Transfusion Reaction    NAME:Justin Farmer             MRN: [id number]             DOB:1940/11/19          AGE: 81 y.o.  ADMISSION DATE: 10/30/2019             DAYS ADMITTED: LOS: 1 day    Justin Farmer is 81 y.o. male with history of INR 3.1 with obstructive jaundice secondary to choledocholithiasis on warfarin for A Fib who received 223 mL of plasma with no premedication.  Patient developed rash, pruritus and hives on arm and torso 60 minutes after the transfusion started.  Patient did not have stridor and wheezing.     Vital Signs:  Temperature: 36.9C to 36.8C; Blood Pressure: 135/59 mmHg to 145/11mmHg; Pulse: 64 to 70; Respirations: 15 to 22; O2Sat: 97% to 95% on Room Air.  Patient was treated with diphenhydramine and symptoms resolved quickly.  Blood bank  investigation for hemolysis was negative.    Physician Interpretation:   Allergic Reaction.    Severity: Non-severe    Imputability: Definite    Recommendation:   (1) Resumption of current transfusion is acceptable if symptoms resolved quickly.   (2) Premedication with antihistamine 45 minutes to 1 hour before future transfusion may be beneficial.               ? Common bile duct dilatation 10/31/2019   ? Left rotator cuff tear 01/26/2017   ? Biventricular automatic implantable cardioverter defibrillator in situ 07/14/2016   ? Coronary artery disease involving native coronary artery of native heart without angina pectoris 12/08/2014     1. 17/07 pharmacologic stress study: Mildly abnormal with slight degree of ischemia in mid to distal inferolateral wall in circumflex distribution. EF 49%, although visually appeared to be 55%. EDV 87 mL.  2. 02/14/05 cardiac catheterization: LAD proximal 40-50% stenosis, proximal mid portion 60% followed by 70-75% stenosis between the first and second diagonal branches. Second diagonal branch 60% ostial stenosis. Circumflex first OM with proximal 50-60% stenosis. RCA 20-30% luminal irregularity in the proximal mid segment. Normal end-diastolic pressure 6-8 mmHg. Left ventriculogram not performed.  3. 05/08/05 coronary angiogram: Left main free of significant disease, LAD proximal 30-40% stenosis followed by 70% mid stenosis between the first and second diagonal branches, 50% stenosis in the LAD at bifurcation or second diagonal branch. Second diagonal branch ostial 60% stenosis. Left circumflex no significant disease. Large obtuse marginal branch proximal 40% stenosis. Right coronary artery 20% luminal narrowing in mid RCA. FFR proximal-mid LAD at baseline 0.90, at maximal hyperemia 0.72. FFR of first obtuse marginal 0.97 at rest, decreased to 0.91 with maximum hyperemia with adenosine.? Status post PCI and stenting of proximal to mid left anterior descending artery with 3.0- x 30.0-mm Driver bare-metal stent.  4. 05/11/2006, exercise thallium.? The patient exercised for 8:33 minutes,achieving 89 percent of MPHR.? Stress thallium negative for ischemia.? His stress ECG negative for ischemia.? EF 59 %.  5. 06/08/08: Lexicon stress thallium: No significant myocardial ischemia. There is diaphragmatic attenuation/and due to hot spot artifact involving the inferior wall. EF 66%  6. 07/18/11: Probably abnormal and demonstrates a relatively small, reversible anteroseptal and apical septal defect. There is also a fixed, true apical component noted. This may suggest limited ischemia in an LAD distribution. EF 53%  7. 08/03/11: Cardiac Cath: Moderate 70% lesion right after the previously placed stent in the proximal LAD.FFR dropped from 0.86 to 0.67. Moderate 40 percent to 50 percent lesion in the large first obtuse marginal vessel, mild 30 percent lesion in the proximal to mid RCA.LAD- stented using a bare-metal stent, Integrity 3.0 x 12 mm and followed by another stent of 2.5 x 12 mm in overlapping fashion.  8. 08/25/11 Echo:? EF 40-45%.? Mild left atrial enlargement.? Mild concentric LVH.? Calcific thickening of noncoronary cusp of aortic valve with no stenosis or ?regurgitation.? PAP=34 mmHg.  9. 06/09/13 Regadenoson Thallium:? EF 42%.? No definite perfusion defects.  10. 09/18/2016: Reg stress thall: EF 55%. probably normal with no evidence of significant myocardial ischemia. The mild fixed reduction in inferolateral/apical lateral tracer activity is probably related to diaphragmatic/soft tissue attenuation     ? Chronic anticoagulation 12/08/2014   ? 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 08/25/11: Echo:EF=40- 45%. Mild left atrial enlargement. Mild LVH. Calcific thickening of noncoronary cusp of aortic valve. No stenosis or regurgitation. PAP 34  2. 10/06/12 Echo:  EF 45%.  LA mildly  enlarged.  3. 10/06/12: Echo: LV EF is about 45%. The LA is mildly enlarged. Mild MR/TR  4. 12/08/14 Echo:  EF 35%. Sclerotic AV with not stenosis or regurgitation.  Mildly dilated sinuses of valsalva -3.7 cm. PAP=27 mmHg.  5. 01/02/2015 CRT-D implant  6. 07/19/15 2D Echo:  EF 40%.  Sclerotic AV.  Mildly dilated sinuses al valsalva (3.9 cm)  7. 08/13/18: Echo: EF 55%.Interventricular septal motion consistent with pacing. Grade I diastolic dysfunction. No significant valvular abnormality. PASP 26.         ? Abnormal cardiovascular stress test 07/31/2011   ? Abnormal thallium stress test 07/31/2011   ? Paroxysmal atrial fibrillation (HCC) 02/22/2011     01/2011: TEE: EF 40-45%. Moderate left atrial enlargement. Mild right atrial enlargement. Mild MR/TR. No thrombus is noted in the left atrial appendage or the left atrium.   02/21/11: Cardioversion: 200 J biphasic  INR followed by PCP, Lynne Leader MD - as of 08/05/11  11/06/14 Holter Monitor:  46 hour Holter monitor demonstrates sinus rhythm with rare premature atrial complex beats (21 in 46 hours) and rare premature ventricular     complex beats (29 in 46 hours).? No episode of significant atrial or ventricular sustained arrhythmia.? No symptoms reported.  ?     ? LBBB (left bundle branch block) 06/05/2009     05/2008: rate related during stress test and noted again on 06/01/09 at rate of 54 bpm     ? Essential hypertension 06/07/2008   ? 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 HLD (hyperlipidemia) 06/07/2008         ROS  Noted in HPI     Physical Exam  BP 118/70 (BP Source: Arm, Left Upper, Patient Position: Sitting)  - Pulse 60  - Ht 175.3 cm (5' 9)  - Wt 97.5 kg (215 lb)  - BMI 31.75 kg/m?   GENERAL APPEARANCE: Patient in no apparent distress.  PSYCH: Appropriate  NEURO: Alert and conversant  VESSELS: JVD is not elevated above the sternal notch.  ENT: Moist mucous membranes  CARDIOVASCULAR:   No parasternal lift Regular rhythm and normal rate. S1 and S2 present.  No murmur.  There is no pericardial rub  GI: Abdomen  obese No appreciable hepatomegaly in the upright position  RESPIRATORY: Clear breath sounds bilaterally  EXTREMITIES: There was no    lower extremity edema   SKIN: no bruising or lesions noted  GAIT: did not witness patient ambulate     Cardiovascular Studies  Noted in HPI     Cardiovascular Health Factors  Vitals BP Readings from Last 3 Encounters:   08/08/21 118/70   05/27/21 138/70   02/18/21 132/84     Wt Readings from Last 3 Encounters:   08/08/21 97.5 kg (215 lb)   05/27/21 98.4 kg (217 lb)   02/18/21 95.3 kg (210 lb)     BMI Readings from Last 3 Encounters:   08/08/21 31.75 kg/m?   05/27/21 31.14 kg/m?   02/18/21 30.13 kg/m?      Smoking Social History     Tobacco Use   Smoking Status Never   Smokeless Tobacco Never   Vaping Use   ? Vaping Use: Never used      Lipid Profile Cholesterol   Date Value Ref Range Status   10/31/2019 95 <200 MG/DL Final     HDL   Date Value Ref Range Status 10/31/2019 22 (  L) >40 MG/DL Final     LDL   Date Value Ref Range Status   10/31/2019 47 <100 mg/dL Final     Triglycerides   Date Value Ref Range Status   10/31/2019 136 <150 MG/DL Final      Blood Sugar Hemoglobin A1C   Date Value Ref Range Status   10/31/2019 6.8 (H) 4.0 - 6.0 % Final     Comment:     The ADA recommends that most patients with type 1 and type 2 diabetes maintain   an A1c level <7%.       Glucose   Date Value Ref Range Status   06/03/2021 137 (H)  Final   09/21/2020 149 (H)  Final   11/07/2019 115 (H) 70 - 100 MG/DL Final   16/10/9602 540 (H) 70 - 110 MG/DL Final   98/11/9145 829 (H) 70 - 110 MG/DL Final     Glucose, POC   Date Value Ref Range Status   02/21/2011 160 (H) 70 - 100 MG/DL Final   56/21/3086 578 (H) 70 - 100 MG/DL Final   46/96/2952 841 (H) 70 - 100 MG/DL Final          Problems Addressed Today  Encounter Diagnoses   Name Primary?   ? Screening for heart disease Yes   ? Pure hypercholesterolemia    ? Essential hypertension    ? Paroxysmal atrial fibrillation (HCC)    ? Coronary artery disease involving native coronary artery of native heart without angina pectoris        Assessment and Plan     All changes underlined  1. Paroxysmal atrial fibrillation  ? Recently seen in the emergency department at [facility] and found to be in atrial fibrillation with rapid ventricular response, discharged in stable condition   ? Currently on metoprolol succinate 100 mg twice daily  ? Preliminary ECG in office today shows that he is AV paced with a ventricular rate of 60 bpm  ? He has a CHA2DS2-VASc score of 5 (age x2, CHF, hypertension, history of valvular disease) and he continues on warfarin for this and gets his INR checked regularly   2. Coronary artery disease status post PCI to LAD 08/03/11  ? Continues on Atorvastatin 40 mg daily  ? Also on beta-blockade with metoprolol XL  ? Has nitroglycerin available as needed  3. Ischemic cardiomyopathy with normalized EF on medical therapy with CRT-D  ? Recent volume overload issues; his HCTZ was just discontinued and he was started on Lasix 40mg  daily   ? Most recent echocardiogram was completed on 7/24 2020 and showed an ejection fraction of 55%, grade 1 diastolic dysfunction and no significant valvular abnormality  ? Most recent device interrogation from 08/06/2021 showed stable heart failure diagnostics, a-paced 27.4% of the time  4. Hyperlipidemia   ? Last lipid profile completed on 10/31/2019 showed total cholesterol 85, triglycerides 136, HDL 22, and LDL 47  ? Continues on atorvastatin 40 mg daily  ? Since it has been a few years since he has had his lipids checked I will repeat a lipid profile  5. Hypertension  ? Blood pressure in office today shows 118/70  ? Continues on metoprolol 100 mg twice daily, losartan 50 mg daily      Patient's questions were answered and they agreed with the above plan.  Specific instructions were typed into their After Visit Summary document.  Follow-up in 3 months or sooner as needed.  Thank you  for the opportunity to participate in the care of your patient.  Please call with questions or concerns.     Maryella Shivers, DNP, APRN, FNP-C           Current Medications (including today's revisions)  ? aspirin EC 81 mg tablet Take 1 Tab by mouth daily.   ? atorvastatin (LIPITOR) 40 mg tablet TAKE ONE TABLET BY MOUTH DAILY.   ? colestipoL (COLESTID) 1 gram tablet Take one tablet by mouth daily.   ? finasteride (PROSCAR) 5 mg tablet Take one tablet by mouth daily.   ? furosemide (LASIX) 40 mg tablet Take one tablet by mouth daily.   ? hydroCHLOROthiazide (HYDRODIURIL) 25 mg tablet TAKE ONE TABLET BY MOUTH EVERY MORNING.   ? HYDROcodone/acetaminophen (NORCO) 5/325 mg tablet Take 1-2 Tabs by mouth every 4 hours as needed for Pain (Patient taking differently: Take one tablet to two tablets by mouth every 4 hours as needed for Pain.  PRN USE (medication a few years old and still uses if needed but does not like how it makes him feel))   ? losartan (COZAAR) 50 mg tablet Take one tablet by mouth twice daily.   ? metoprolol XL (TOPROL XL) 200 mg extended release tablet Take one-half tablet by mouth twice daily. (Patient taking differently: Take one-half tablet by mouth daily. Take 100 mg in the morning and 100 mg at night.)   ? nitroglycerin (NITROSTAT) 0.4 mg tablet PLACE 1 TABLET UNDER TONGUE EVERY 5 MINUTES AS NEEDED FOR CHEST PAIN. MAY REPEAT FOR 3 DOSES   ? omeprazole DR (PRILOSEC) 20 mg capsule Take one capsule by mouth daily before breakfast.   ? tamsulosin (FLOMAX) 0.4 mg capsule Take one capsule by mouth daily.   ? VIT C/VIT E ACETATE/LUTEIN/MIN (OCUVITE LUTEIN PO) Take 1 Tab by mouth daily.   ? warfarin (COUMADIN) 5 mg tablet Take 1 Tab by mouth at bedtime daily. as directed based on INR.

## 2021-08-13 ENCOUNTER — Encounter: Admit: 2021-08-13 | Discharge: 2021-08-13 | Payer: MEDICARE

## 2021-08-13 DIAGNOSIS — I2510 Atherosclerotic heart disease of native coronary artery without angina pectoris: Secondary | ICD-10-CM

## 2021-08-13 DIAGNOSIS — I10 Essential (primary) hypertension: Secondary | ICD-10-CM

## 2021-08-13 DIAGNOSIS — E7800 Pure hypercholesterolemia, unspecified: Secondary | ICD-10-CM

## 2021-08-13 DIAGNOSIS — I480 Paroxysmal atrial fibrillation: Secondary | ICD-10-CM

## 2021-08-13 MED ORDER — METOPROLOL SUCCINATE 100 MG PO TB24
100 mg | ORAL_TABLET | Freq: Two times a day (BID) | ORAL | 3 refills | 90.00000 days | Status: AC
Start: 2021-08-13 — End: ?

## 2021-08-19 ENCOUNTER — Encounter: Admit: 2021-08-19 | Discharge: 2021-08-19 | Payer: MEDICARE

## 2021-08-19 NOTE — Telephone Encounter
I called and reviewed recent labs with Justin Farmer. He verbalized understanding at this time. All questions answered. He will call the office as needed.

## 2021-08-19 NOTE — Telephone Encounter
-----   Message from Oswaldo Milian, APRN-NP sent at 08/18/2021  7:23 AM CDT -----  Randie Heinz, his LDL was at goal. Triglycerides were a little high but not crazy, he could make some diet adjustments and I think it would help. Please let him know. Thanks!    Eber Jones

## 2021-09-02 ENCOUNTER — Encounter: Admit: 2021-09-02 | Discharge: 2021-09-02 | Payer: MEDICARE

## 2021-11-01 ENCOUNTER — Encounter: Admit: 2021-11-01 | Discharge: 2021-11-01 | Payer: MEDICARE

## 2021-12-02 ENCOUNTER — Ambulatory Visit: Admit: 2021-12-02 | Discharge: 2021-12-02 | Payer: MEDICARE

## 2021-12-02 ENCOUNTER — Encounter: Admit: 2021-12-02 | Discharge: 2021-12-02 | Payer: MEDICARE

## 2021-12-02 DIAGNOSIS — I10 Essential (primary) hypertension: Secondary | ICD-10-CM

## 2021-12-02 DIAGNOSIS — I480 Paroxysmal atrial fibrillation: Secondary | ICD-10-CM

## 2021-12-02 DIAGNOSIS — I447 Left bundle-branch block, unspecified: Secondary | ICD-10-CM

## 2021-12-02 DIAGNOSIS — Z95810 Presence of automatic (implantable) cardiac defibrillator: Secondary | ICD-10-CM

## 2021-12-02 DIAGNOSIS — I2510 Atherosclerotic heart disease of native coronary artery without angina pectoris: Secondary | ICD-10-CM

## 2021-12-02 DIAGNOSIS — E7800 Pure hypercholesterolemia, unspecified: Secondary | ICD-10-CM

## 2021-12-02 NOTE — Progress Notes
Date of Service: 12/02/2021    Justin Farmer is a 81 y.o. male.       HPI     Patient is now an 81 year old Caucasian male past medical history of cardiomyopathy he does have a BiV ICD and recovered left ventricular ejection fraction, history of paroxysmal atrial fibrillation he is on chronic warfarin therapy with good control and no significant bruising or bleeding.  Did have lone episode of paroxysmal atrial fibrillation this summer recovered without significant intervention and clinically has done well and device checks have not shown significant volume excess or recurrent arrhythmia events.  He is not having orthopnea or PND.  No syncope or near syncopal episode.  He was moved taking his metoprolol XL at a later in the morning and since doing that lightheadedness and dizziness is improved and overall blood pressure to start the day is also better.  He is otherwise compliant tolerant with all of his medication.  Known coronary artery disease with previous percutaneous coronary intervention.  He does have a chronic left bundle branch block, but also has had BiV ICD in place.         Vitals:    12/02/21 1300   BP: (!) 128/90   BP Source: Arm, Right Upper   Pulse: 65   SpO2: 97%   PainSc: Zero   Weight: 99.8 kg (220 lb)   Height: 175.3 cm (5' 9)     Body mass index is 32.49 kg/m?Marland Kitchen     Past Medical History  Patient Active Problem List    Diagnosis Date Noted   ? Other cardiomyopathies (HCC) 09/27/2020   ? Melena 11/05/2019   ? Transfusion reaction 11/01/2019       Allergic Transfusion Reaction    NAME:Justin Farmer             MRN: [id number]             DOB:[birth year]          AGE: 81 y.o.  ADMISSION DATE: 10/30/2019             DAYS ADMITTED: LOS: 1 day    Justin Farmer is 81 y.o. male with history of INR 3.1 with obstructive jaundice secondary to choledocholithiasis on warfarin for A Fib who received 223 mL of plasma with no premedication.  Patient developed rash, pruritus and hives on arm and torso 60 minutes after the transfusion started.  Patient did not have stridor and wheezing.     Vital Signs:  Temperature: 36.9C to 36.8C; Blood Pressure: 135/59 mmHg to 145/68mmHg; Pulse: 64 to 70; Respirations: 15 to 22; O2Sat: 97% to 95% on Room Air.  Patient was treated with diphenhydramine and symptoms resolved quickly.  Blood bank investigation for hemolysis was negative.    Physician Interpretation:   Allergic Reaction.    Severity: Non-severe    Imputability: Definite    Recommendation:   (1) Resumption of current transfusion is acceptable if symptoms resolved quickly.   (2) Premedication with antihistamine 45 minutes to 1 hour before future transfusion may be beneficial.               ? Common bile duct dilatation 10/31/2019   ? Left rotator cuff tear 01/26/2017   ? Biventricular automatic implantable cardioverter defibrillator in situ 07/14/2016   ? Coronary artery disease involving native coronary artery of native heart without angina pectoris 12/08/2014     1. 17/07 pharmacologic stress study: Mildly abnormal with slight degree of ischemia in  mid to distal inferolateral wall in circumflex distribution. EF 49%, although visually appeared to be 55%. EDV 87 mL.  2. 02/14/05 cardiac catheterization: LAD proximal 40-50% stenosis, proximal mid portion 60% followed by 70-75% stenosis between the first and second diagonal branches. Second diagonal branch 60% ostial stenosis. Circumflex first OM with proximal 50-60% stenosis. RCA 20-30% luminal irregularity in the proximal mid segment. Normal end-diastolic pressure 6-8 mmHg. Left ventriculogram not performed.  3. 05/08/05 coronary angiogram: Left main free of significant disease, LAD proximal 30-40% stenosis followed by 70% mid stenosis between the first and second diagonal branches, 50% stenosis in the LAD at bifurcation or second diagonal branch. Second diagonal branch ostial 60% stenosis. Left circumflex no significant disease. Large obtuse marginal branch proximal 40% stenosis. Right coronary artery 20% luminal narrowing in mid RCA. FFR proximal-mid LAD at baseline 0.90, at maximal hyperemia 0.72. FFR of first obtuse marginal 0.97 at rest, decreased to 0.91 with maximum hyperemia with adenosine.? Status post PCI and stenting of proximal to mid left anterior descending artery with 3.0- x 30.0-mm Driver bare-metal stent.  4. 05/11/2006, exercise thallium.? The patient exercised for 8:33 minutes,achieving 89 percent of MPHR.? Stress thallium negative for ischemia.? His stress ECG negative for ischemia.? EF 59 %.  5. 06/08/08: [facility]icon stress thallium: No significant myocardial ischemia. There is diaphragmatic attenuation/and due to hot spot artifact involving the inferior wall. EF 66%  6. 07/18/11: Probably abnormal and demonstrates a relatively small, reversible anteroseptal and apical septal defect. There is also a fixed, true apical component noted. This may suggest limited ischemia in an LAD distribution. EF 53%  7. 08/03/11: Cardiac Cath: Moderate 70% lesion right after the previously placed stent in the proximal LAD.FFR dropped from 0.86 to 0.67. Moderate 40 percent to 50 percent lesion in the large first obtuse marginal vessel, mild 30 percent lesion in the proximal to mid RCA.LAD- stented using a bare-metal stent, Integrity 3.0 x 12 mm and followed by another stent of 2.5 x 12 mm in overlapping fashion.  8. 08/25/11 Echo:? EF 40-45%.? Mild left atrial enlargement.? Mild concentric LVH.? Calcific thickening of noncoronary cusp of aortic valve with no stenosis or ?regurgitation.? PAP=34 mmHg.  9. 06/09/13 Regadenoson Thallium:? EF 42%.? No definite perfusion defects.  10. 09/18/2016: Reg stress thall: EF 55%. probably normal with no evidence of significant myocardial ischemia. The mild fixed reduction in inferolateral/apical lateral tracer activity is probably related to diaphragmatic/soft tissue attenuation     ? Chronic anticoagulation 12/08/2014   ? 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 08/25/11: Echo:EF=40- 45%. Mild left atrial enlargement. Mild LVH. Calcific thickening of noncoronary cusp of aortic valve. No stenosis or regurgitation. PAP 34  2. 10/06/12 Echo:  EF 45%.  LA mildly enlarged.  3. 10/06/12: Echo: LV EF is about 45%. The LA is mildly enlarged. Mild MR/TR  4. 12/08/14 Echo:  EF 35%. Sclerotic AV with not stenosis or regurgitation.  Mildly dilated sinuses of valsalva -3.7 cm. PAP=27 mmHg.  5. 01/02/2015 CRT-D implant  6. 07/19/15 2D Echo:  EF 40%.  Sclerotic AV.  Mildly dilated sinuses al valsalva (3.9 cm)  7. 08/13/18: Echo: EF 55%.Interventricular septal motion consistent with pacing. Grade I diastolic dysfunction. No significant valvular abnormality. PASP 26.         ? Abnormal cardiovascular stress test 07/31/2011   ? Abnormal thallium stress test 07/31/2011   ? Paroxysmal atrial fibrillation (HCC) 02/22/2011     01/2011: TEE: EF 40-45%. Moderate left atrial enlargement. Mild right atrial  enlargement. Mild MR/TR. No thrombus is noted in the left atrial appendage or the left atrium.   02/21/11: Cardioversion: 200 J biphasic  INR followed by PCP, Lynne Leader MD - as of 08/05/11  11/06/14 Holter Monitor:  46 hour Holter monitor demonstrates sinus rhythm with rare premature atrial complex beats (21 in 46 hours) and rare premature ventricular     complex beats (29 in 46 hours).? No episode of significant atrial or ventricular sustained arrhythmia.? No symptoms reported.  ?     ? LBBB (left bundle branch block) 06/05/2009     05/2008: rate related during stress test and noted again on 06/01/09 at rate of 54 bpm     ? Essential hypertension 06/07/2008   ? 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 HLD (hyperlipidemia) 06/07/2008         Review of Systems   Constitutional: Negative.   HENT: Negative.    Eyes: Negative.    Cardiovascular: Negative.    Respiratory: Negative.    Endocrine: Negative. Hematologic/Lymphatic: Negative.    Skin: Negative.    Musculoskeletal: Negative.    Gastrointestinal: Negative.    Genitourinary: Negative.    Neurological: Negative.    Psychiatric/Behavioral: Negative.    Allergic/Immunologic: Negative.        Physical Exam  Awake and alert, pleasant gentleman who appears older  Pupils are equal round without scleral injection  Neck is supple no carotid upstroke and no bruits, mass, or jugular venous abnormalities  Chest is symmetric lungs clear to auscultation  Heart S1, S2 that is normal.  No significant murmur, click, gallop  Abdomen is flat nontender  Pulse are 2+, regular, and symmetric bilaterally radial as well as pedal location  No peripheral edema and skin is otherwise intact without significant bruising  Cardiovascular Studies      Cardiovascular Health Factors  Vitals BP Readings from Last 3 Encounters:   12/02/21 (!) 128/90   08/08/21 118/70   05/27/21 138/70     Wt Readings from Last 3 Encounters:   12/02/21 99.8 kg (220 lb)   08/08/21 97.5 kg (215 lb)   05/27/21 98.4 kg (217 lb)     BMI Readings from Last 3 Encounters:   12/02/21 32.49 kg/m?   08/08/21 31.75 kg/m?   05/27/21 31.14 kg/m?      Smoking Social History     Tobacco Use   Smoking Status Never   Smokeless Tobacco Never      Lipid Profile Cholesterol   Date Value Ref Range Status   08/12/2021 138  Final     HDL   Date Value Ref Range Status   08/12/2021 29 (L)  Final     LDL   Date Value Ref Range Status   08/12/2021 67  Final     Triglycerides   Date Value Ref Range Status   08/12/2021 209 (H)  Final      Blood Sugar Hemoglobin A1C   Date Value Ref Range Status   10/31/2019 6.8 (H) 4.0 - 6.0 % Final     Comment:     The ADA recommends that most patients with type 1 and type 2 diabetes maintain   an A1c level <7%.       Glucose   Date Value Ref Range Status   08/12/2021 138 (H)  Final   06/03/2021 137 (H)  Final   09/21/2020 149 (H)  Final  05/09/2005 126 (H) 70 - 110 MG/DL Final   14/78/2956 213 (H) 70 - 110 MG/DL Final     Glucose, POC   Date Value Ref Range Status   02/21/2011 160 (H) 70 - 100 MG/DL Final   08/65/7846 962 (H) 70 - 100 MG/DL Final   95/28/4132 440 (H) 70 - 100 MG/DL Final          Problems Addressed Today  Encounter Diagnoses   Name Primary?   ? Pure hypercholesterolemia    ? Essential hypertension    ? Paroxysmal atrial fibrillation (HCC)    ? Coronary artery disease involving native coronary artery of native heart without angina pectoris        Assessment and Plan     No findings for unstable cardiovascular disease.  Rhythm is maintaining sinus rhythm and device checks do not show any findings for heart failure with stable pacing.  With transitioning his Toprol-XL to twice daily and later in the day his blood pressure is stabilized and no more symptoms of orthostasis or low blood pressure.  He is on appropriate anticoagulation and medical therapy with good compliance.  Electrolytes and renal function was checked over the summer and is monitored routinely by his primary physician.  I made no changes at this time and routine follow-up will be in approximately 6 months with ongoing regular device checks.         Current Medications (including today's revisions)  ? aspirin EC 81 mg tablet Take 1 Tab by mouth daily.   ? atorvastatin (LIPITOR) 40 mg tablet TAKE ONE TABLET BY MOUTH DAILY.   ? colestipoL (COLESTID) 1 gram tablet Take one tablet by mouth daily.   ? finasteride (PROSCAR) 5 mg tablet Take one tablet by mouth daily.   ? furosemide (LASIX) 40 mg tablet Take one tablet by mouth daily.   ? hydroCHLOROthiazide (HYDRODIURIL) 25 mg tablet TAKE ONE TABLET BY MOUTH EVERY MORNING.   ? HYDROcodone/acetaminophen (NORCO) 5/325 mg tablet Take 1-2 Tabs by mouth every 4 hours as needed for Pain (Patient taking differently: Take one tablet to two tablets by mouth every 4 hours as needed for Pain.  PRN USE (medication a few years old and still uses if needed but does not like how it makes him feel))   ? losartan (COZAAR) 50 mg tablet Take one tablet by mouth twice daily.   ? metoprolol succinate XL (TOPROL XL) 100 mg extended release tablet Take one tablet by mouth twice daily.   ? nitroglycerin (NITROSTAT) 0.4 mg tablet PLACE 1 TABLET UNDER TONGUE EVERY 5 MINUTES AS NEEDED FOR CHEST PAIN. MAY REPEAT FOR 3 DOSES   ? omeprazole DR (PRILOSEC) 20 mg capsule Take one capsule by mouth daily before breakfast.   ? tamsulosin (FLOMAX) 0.4 mg capsule Take one capsule by mouth daily.   ? VIT C/VIT E ACETATE/LUTEIN/MIN (OCUVITE LUTEIN PO) Take 1 Tab by mouth daily.   ? warfarin (COUMADIN) 5 mg tablet Take 1 Tab by mouth at bedtime daily. as directed based on INR.

## 2021-12-17 ENCOUNTER — Encounter: Admit: 2021-12-17 | Discharge: 2021-12-17 | Payer: MEDICARE

## 2021-12-17 NOTE — Telephone Encounter
Chest Pain    Current or past: Past, yesterday evening 11/27  Length of episode: 30-45 minutes  Pain Description: intermittent  Radiating?: No  Pain change with movement?: No  Associated Symptoms: None  Precipitating factors: n/a  Take anything for pain?: No  Additional relevant information: Chest pain left side of chest. Sudden onset. Took one Nitro and pain went away in about 30-45 minutes. No other episodes of chest pain. Today chest pain free. No symptoms reported.  BP last night around episode 148/62, pulse 70. This morning BP 124/67, pulse 64.     Patient states he sent in a remote transmission. (Will have remote team look for report)     Care Advice: Continue to monitor for chest pain. Single isolated episode. Does have CAD hx with PCI. Will follow up with patient when remote report is reviewed. Educated to seek ER care if chest pain comes back or develops any other symptoms.  Patient verbalized understanding.    Disposition: RN to Call Patient Back with Provider Recommendations

## 2021-12-18 ENCOUNTER — Encounter: Admit: 2021-12-18 | Discharge: 2021-12-18 | Payer: MEDICARE

## 2021-12-18 NOTE — Telephone Encounter
Cheri Guppy, RN  Valora Piccolo, RN  Caller: Unspecified (Yesterday, 4:59 PM)  Abnormal detections of junctional rhythms in the 70-80's yesterday.   4 total - longest was 49 seconds.     Please see summary.   Jodelle Gross

## 2021-12-18 NOTE — Telephone Encounter
I called and followed up with Justin Farmer today. He reports doing well. No other episodes of chest pain. Reviewed remote report with him. Encouraged continued monitoring at home and call the office with any concerns. Understanding verbalized.

## 2022-01-31 ENCOUNTER — Encounter: Admit: 2022-01-31 | Discharge: 2022-01-31 | Payer: MEDICARE

## 2022-02-04 ENCOUNTER — Encounter: Admit: 2022-02-04 | Discharge: 2022-02-04 | Payer: MEDICARE

## 2022-02-04 NOTE — Telephone Encounter
Patient called during closed office hours on 1/15. Called patient back today and he reports yesterday he had chest pain so took a nitro and it didn't help, he took a 2nd one and that helped so he went to the ED in [place] and all the testing for his heart came back negative. No concerns or complaints at this time. He is doing fine now. All questions answered. Asked pt to call us should his symptoms return.

## 2022-02-06 ENCOUNTER — Encounter: Admit: 2022-02-06 | Discharge: 2022-02-06 | Payer: MEDICARE

## 2022-03-11 ENCOUNTER — Encounter: Admit: 2022-03-11 | Discharge: 2022-03-11 | Payer: MEDICARE

## 2022-04-15 ENCOUNTER — Encounter: Admit: 2022-04-15 | Discharge: 2022-04-15 | Payer: MEDICARE

## 2022-04-17 ENCOUNTER — Encounter: Admit: 2022-04-17 | Discharge: 2022-04-17 | Payer: MEDICARE

## 2022-04-17 NOTE — Telephone Encounter
Pt. called our office to let us know he went back into afib RVR over the weekend and was just discharged from [facility]. Pt. was advised to contact our office to see about starting a medication that would help prevent him from going back into afib. I advised pt. I would request his medical records and discuss with Dr. Lafe Garin to see what options he would have. Pt. verbalized understanding and denied any questions at this time.

## 2022-04-17 NOTE — Progress Notes
Please fax the following information for continuation of care: Justin Farmer DOB Aug 10, 1940  ER discharge summary from March 2024  Medication list  EKG's  Recent Labs  Any other cardiac testing        Patient has an upcoming appointment with Dr. Lafe Garin    FAX number  [phone]    Please send records as soon as possible.     Thank you!

## 2022-04-30 ENCOUNTER — Encounter: Admit: 2022-04-30 | Discharge: 2022-04-30 | Payer: MEDICARE

## 2022-04-30 DIAGNOSIS — Z95810 Presence of automatic (implantable) cardiac defibrillator: Secondary | ICD-10-CM

## 2022-04-30 NOTE — Telephone Encounter
-----   Message from Griffith Citron, [place] sent at 04/30/2022 11:05 AM CDT -----  Regarding: due for in office device check (prior to OV ) no orders/schedule  due for in office device check (prior to OVscheduled in June w/SHT OV) no orders/schedule (expected q60months for pts w/CRT-Ds last: 11/2021 w/SHT OV)       plz order/schedule if possible(same day/location prior to OV) and notify pt to arrive early    Moving forward, it is helpful to enter future orders for device checks at the same time future OVs are ordered this helps avoid late add-ons, misses, streamlines work for the schedulers and in most cases, can potentially save pts an extra trip into the clinic for a device check only.     Thank you in advance!!   -Kaylee/Device Team

## 2022-05-31 ENCOUNTER — Encounter: Admit: 2022-05-31 | Discharge: 2022-05-31 | Payer: MEDICARE

## 2022-05-31 MED ORDER — ATORVASTATIN 40 MG PO TAB
40 mg | ORAL_TABLET | Freq: Every day | ORAL | 3 refills
Start: 2022-05-31 — End: ?

## 2022-06-04 ENCOUNTER — Encounter: Admit: 2022-06-04 | Discharge: 2022-06-04 | Payer: MEDICARE

## 2022-06-30 ENCOUNTER — Encounter: Admit: 2022-06-30 | Discharge: 2022-06-30 | Payer: MEDICARE

## 2022-06-30 MED ORDER — LOSARTAN 50 MG PO TAB
ORAL_TABLET | ORAL | 3 refills | 90.00000 days | Status: AC
Start: 2022-06-30 — End: ?

## 2022-07-13 NOTE — Progress Notes
Cardiovascular Medicine     Date of Service: 07/14/2022    HPI     I had the pleasure of seeing Justin Farmer for a followup visit in the Cardiovascular Medicine Clinic at [facility].     Justin Farmer as you may recall is a 82 y.o. male patient of Dr. Harvel Ricks with a history of coronary artery disease status post PCI to LAD 2013, paroxysmal atrial fibrillation on Warfarin, hyperlipidemia, ischemic cardiomyopathy with biventricular ICD, and kidney donor.     He returns to clinic today for a 4 month follow up and device interrogation accompanied by his son, Justin Farmer. Abhimanyu tells me he has been experiencing some low blood pressure readings which prompted his PCP to reduce his dose of Losartan to 50 mg daily (from 50 mg BID dosing). He has also self reduced his Toprol XL from 100 mg twice daily to 50 mg twice daily d/t symptoms including hypotension, lightheadedness, and fatigue. Justin Farmer tells me he made these changes ~ 2 months ago and continues to have good BP control with a resolve of his symptoms. He monitors his BP at home daily and notes consistent readings from 110-130 mmHg systolic. BP in office today 116/90. He denies chest pain/pressure, dyspnea at rest or with exertion, palpitations, lower extremity swelling, orthopnea, PND, and syncope or falls.        Vitals:    07/14/22 1330   BP: (!) 116/90   BP Source: Arm, Left Upper   Pulse: 60   SpO2: 98%   O2 Device: None (Room air)   PainSc: Zero   Weight: 97.5 kg (215 lb)   Height: 175.3 cm (5' 9)     Body mass index is 31.75 kg/m?Marland Kitchen     Past Medical History  Patient Active Problem List    Diagnosis Date Noted    Other cardiomyopathies (HCC) 09/27/2020    Melena 11/05/2019    Transfusion reaction 11/01/2019       Allergic Transfusion Reaction    NAME:Justin Farmer             MRN: [id number]             DOB:[birth year]          AGE: 82 y.o.  ADMISSION DATE: 10/30/2019             DAYS ADMITTED: LOS: 1 day    Justin Farmer is 82 y.o. male with history of INR 3.1 with obstructive jaundice secondary to choledocholithiasis on warfarin for A Fib who received 223 mL of plasma with no premedication.  Patient developed rash, pruritus and hives on arm and torso 60 minutes after the transfusion started.  Patient did not have stridor and wheezing.     Vital Signs:  Temperature: 36.9C to 36.8C; Blood Pressure: 135/59 mmHg to 145/50mmHg; Pulse: 64 to 70; Respirations: 15 to 22; O2Sat: 97% to 95% on Room Air.  Patient was treated with diphenhydramine and symptoms resolved quickly.  Blood bank investigation for hemolysis was negative.    Physician Interpretation:   Allergic Reaction.    Severity: Non-severe    Imputability: Definite    Recommendation:   (1) Resumption of current transfusion is acceptable if symptoms resolved quickly.   (2) Premedication with antihistamine 45 minutes to 1 hour before future transfusion may be beneficial.                Common bile duct dilatation 10/31/2019    Left rotator  cuff tear 01/26/2017    Biventricular automatic implantable cardioverter defibrillator in situ 07/14/2016    Coronary artery disease involving native coronary artery of native heart without angina pectoris 12/08/2014     17/07 pharmacologic stress study: Mildly abnormal with slight degree of ischemia in mid to distal inferolateral wall in circumflex distribution. EF 49%, although visually appeared to be 55%. EDV 87 mL.  02/14/05 cardiac catheterization: LAD proximal 40-50% stenosis, proximal mid portion 60% followed by 70-75% stenosis between the first and second diagonal branches. Second diagonal branch 60% ostial stenosis. Circumflex first OM with proximal 50-60% stenosis. RCA 20-30% luminal irregularity in the proximal mid segment. Normal end-diastolic pressure 6-8 mmHg. Left ventriculogram not performed.  05/08/05 coronary angiogram: Left main free of significant disease, LAD proximal 30-40% stenosis followed by 70% mid stenosis between the first and second diagonal branches, 50% stenosis in the LAD at bifurcation or second diagonal branch. Second diagonal branch ostial 60% stenosis. Left circumflex no significant disease. Large obtuse marginal branch proximal 40% stenosis. Right coronary artery 20% luminal narrowing in mid RCA. FFR proximal-mid LAD at baseline 0.90, at maximal hyperemia 0.72. FFR of first obtuse marginal 0.97 at rest, decreased to 0.91 with maximum hyperemia with adenosine.  Status post PCI and stenting of proximal to mid left anterior descending artery with 3.0- x 30.0-mm Driver bare-metal stent.  05/11/2006, exercise thallium.  The patient exercised for 8:33 minutes,achieving 89 percent of MPHR.  Stress thallium negative for ischemia.  His stress ECG negative for ischemia.  EF 59 %.  06/08/08: Lexicon stress thallium: No significant myocardial ischemia. There is diaphragmatic attenuation/and due to hot spot artifact involving the inferior wall. EF 66%  07/18/11: Probably abnormal and demonstrates a relatively small, reversible anteroseptal and apical septal defect. There is also a fixed, true apical component noted. This may suggest limited ischemia in an LAD distribution. EF 53%  08/03/11: Cardiac Cath: Moderate 70% lesion right after the previously placed stent in the proximal LAD.FFR dropped from 0.86 to 0.67. Moderate 40 percent to 50 percent lesion in the large first obtuse marginal vessel, mild 30 percent lesion in the proximal to mid RCA.LAD- stented using a bare-metal stent, Integrity 3.0 x 12 mm and followed by another stent of 2.5 x 12 mm in overlapping fashion.  08/25/11 Echo:  EF 40-45%.  Mild left atrial enlargement.  Mild concentric LVH.  Calcific thickening of noncoronary cusp of aortic valve with no stenosis or  regurgitation.  PAP=34 mmHg.  06/09/13 Regadenoson Thallium:  EF 42%.  No definite perfusion defects.  09/18/2016: Reg stress thall: EF 55%. probably normal with no evidence of significant myocardial ischemia. The mild fixed reduction in inferolateral/apical lateral tracer activity is probably related to diaphragmatic/soft tissue attenuation      Chronic anticoagulation 12/08/2014    Ischemic cardiomyopathy 01/14/2012     08/25/11: Echo:EF=40- 45%. Mild left atrial enlargement. Mild LVH. Calcific thickening of noncoronary cusp of aortic valve. No stenosis or regurgitation. PAP 34  10/06/12 Echo:  EF 45%.  LA mildly enlarged.  10/06/12: Echo: LV EF is about 45%. The LA is mildly enlarged. Mild MR/TR  12/08/14 Echo:  EF 35%. Sclerotic AV with not stenosis or regurgitation.  Mildly dilated sinuses of valsalva -3.7 cm. PAP=27 mmHg.  01/02/2015 CRT-D implant  07/19/15 2D Echo:  EF 40%.  Sclerotic AV.  Mildly dilated sinuses al valsalva (3.9 cm)  08/13/18: Echo: EF 55%.Interventricular septal motion consistent with pacing. Grade I diastolic dysfunction. No significant valvular abnormality. PASP 26.  Abnormal cardiovascular stress test 07/31/2011    Abnormal thallium stress test 07/31/2011    Paroxysmal atrial fibrillation (HCC) 02/22/2011     01/2011: TEE: EF 40-45%. Moderate left atrial enlargement. Mild right atrial enlargement. Mild MR/TR. No thrombus is noted in the left atrial appendage or the left atrium.   02/21/11: Cardioversion: 200 J biphasic  INR followed by PCP, Justin Leader MD - as of 08/05/11  11/06/14 Holter Monitor:  46 hour Holter monitor demonstrates sinus rhythm with rare premature atrial complex beats (21 in 46 hours) and rare premature ventricular     complex beats (29 in 46 hours).  No episode of significant atrial or ventricular sustained arrhythmia.  No symptoms reported.         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    Murmurs: no cardiac murmurs   Extremities: no lower extremity edema  Neurologic Exam: oriented to time, place and person; no focal neurologic deficits  Psychiatric: Normal mood and affect.  Behavior is normal. Judgment and thought content normal.        Cardiovascular Studies  Preliminary EKG from today:     Most recent results for 12-Lead ECG   ECG 12-LEAD    Collection Time: 07/14/22  1:29 PM   Result Value Status    VENTRICULAR RATE 60 Incomplete    P-R INTERVAL 158 Incomplete    QRS DURATION 104 Incomplete    Q-T INTERVAL 456 Incomplete    QTC CALCULATION (BAZETT) 456 Incomplete    P AXIS 83 Incomplete    R AXIS -66 Incomplete    T AXIS 65 Incomplete    Impression    Atrial-paced rhythm  Left axis deviation  Low voltage QRS  Inferior infarct , age undetermined  Anterolateral infarct , age undetermined  Abnormal ECG  When compared with ECG of 08-Aug-2021 13:30,  Electronic atrial pacemaker has replaced Electronic ventricular pacemaker      Cardiovascular Health Factors  Vitals BP Readings from Last 3 Encounters:   07/14/22 (!) 116/90   12/02/21 (!) 128/90   08/08/21 118/70     Wt Readings from Last 3 Encounters:   07/14/22 97.5 kg (215 lb)   12/02/21 99.8 kg (220 lb)   08/08/21 97.5 kg (215 lb)     BMI Readings from Last 3 Encounters:   07/14/22 31.75 kg/m?   12/02/21 32.49 kg/m?   08/08/21 31.75 kg/m?      Smoking Social History     Tobacco Use   Smoking Status Never   Smokeless Tobacco Never      Lipid Profile Cholesterol   Date Value Ref Range Status   08/12/2021 138  Final     HDL   Date Value Ref Range Status 08/12/2021 29 (L)  Final  LDL   Date Value Ref Range Status   08/12/2021 67  Final     Triglycerides   Date Value Ref Range Status   08/12/2021 209 (H)  Final      Blood Sugar Hemoglobin A1C   Date Value Ref Range Status   10/31/2019 6.8 (H) 4.0 - 6.0 % Final     Comment:     The ADA recommends that most patients with type 1 and type 2 diabetes maintain   an A1c level <7%.       Glucose   Date Value Ref Range Status   08/12/2021 138 (H)  Final   06/03/2021 137 (H)  Final   09/21/2020 149 (H)  Final   05/09/2005 126 (H) 70 - 110 MG/DL Final   57/84/6962 952 (H) 70 - 110 MG/DL Final     Glucose, POC   Date Value Ref Range Status   02/21/2011 160 (H) 70 - 100 MG/DL Final   84/13/2440 102 (H) 70 - 100 MG/DL Final   72/53/6644 034 (H) 70 - 100 MG/DL Final          Problems Addressed Today  Encounter Diagnoses   Name Primary?    Screening for heart disease Yes    Paroxysmal atrial fibrillation (HCC)     Biventricular automatic implantable cardioverter defibrillator in situ     Pure hypercholesterolemia     Essential hypertension     Ischemic cardiomyopathy     Coronary artery disease involving native coronary artery of native heart without angina pectoris        Assessment and Plan     Ischemic cardiomyopathy s/p CRT-D 12/2014  Hypertension  Heart failure with preserved ejection fraction: (LVEF 55% on TTE from 07/2018)  Grade I (mild) left ventricular diastolic dysfunction.   GDMT PTA Changes   ACE/ARB/ARNI Losartan 50 mg daily    SGLT-2 Inhibitor CKD s/p kidney donation    Aldosterone Antagonist Will hold off on initiation today d/t recent hypotension    Diuretic Furosemide 40 mg daily    Anticoagulation for Afib/flutter Warfarin therapy. Previous discussions regarding transitioning to DOAC but it appears patient has opted to remain on Warfarin.    I (no limitations of physical activity, ordinary activity does not cause symptoms of HF)  - Dry weight ~ 215 lbs. Weight today 215 lbs. Encouraged daily weight monitoring  - Patient appears euvolemic on exam today.  - Reinforced 2 g sodium diet and 2 L fluid restriction.   - Encouraged ongoing home BP monitoring and to notify us for consistently elevated or low readings.   - Patient tells me he had labs completed in the last 6 months in [facility]. I do not see these results in Care Everywhere. Will try to obtain records.     Paroxysmal atrial fibrillation  - EKG in office today reveals atrial paced rhythm at 60 bpm  - Elevated CHA2DS2-VASc score of at least 4 (age, CHF, vascular disease)  - Continues Warfarin therapy without s/s of bleeding. Previous discussions regarding transitioning to DOAC but it appears patient has opted to remain on Warfarin.  - Continues Toprol XL 50 mg twice daily   - He denies recent known episodes or symptoms of atrial fibrillation.   - Device interrogation from today pending.    Coronary artery disease:   status post PCI to LAD in 2013  - Stress test 09/2020 probably normal with no evidence of significant myocardial ischemia. There is shifting soft tissue attenuation involving  the apical and mid inferior wall segments as described above. Left ventricular systolic function is low normal. Left ventricular systolic function was 49% at rest and 54% post stress.   - Continues aspirin 81 mg daily   - Denies angina or anginal equivalent and/or dyspnea on exertion.  - Red flag warnings of chest pain given to patient including when to seek emergency evaluation (I.e. if chest pain/discomfort persists with rest and not limited to other symptoms such as SOA, arm numbness, diaphoresis.)  - Continue ongoing risk reduction with medication therapy and lifestyle modifications.     Hyperlipidemia: Optimally controlled  Last Lipid Profile Cholesterol   Date Value Ref Range Status   08/12/2021 138  Final     HDL   Date Value Ref Range Status   08/12/2021 29 (L)  Final     LDL   Date Value Ref Range Status   08/12/2021 67  Final     Triglycerides   Date Value Ref Range Status   08/12/2021 209 (H)  Final      - Currently on atorvastatin 40 mg daily.  - Continue to monitor.  - Encouraged continued lifestyle modification such as healthy diet and daily exercise.     Follow Up Visit:  Follow up with Dr. Harvel Ricks in 6 months with device interrogation.      I have personally documented the HPI, exam and medical decision making. I have instructed the patient on the plan of care and they verbalize understanding of the plan. Please see AVS for full patient teaching. Patient advised to call our office or message [place] via My Chart if s/he has any problems, questions, worsening symptoms, or concerns prior to the next appointment.        Thank you for allowing me to participate in the care of this patient. If you have any questions please do not hesitate to contact our office.     This note was in part completed with Dragon, a speech recognition software.  Some grammatical and transcription errors may have occurred.  If you have any concern, please contact the the our office for clarification.     Wynelle Link, FNP, APRN, FNP-BC  Collaborating Physician: Dr. Shela Commons. [place]/ Dr. Ihor Austin  Cardiovascular Medicine at [facility] [place] [facility]  Phone: [phone]           Current Medications (including today's revisions)   aspirin EC 81 mg tablet Take 1 Tab by mouth daily.    atorvastatin (LIPITOR) 40 mg tablet TAKE ONE TABLET BY MOUTH DAILY.    colestipoL (COLESTID) 1 gram tablet Take one tablet by mouth daily.    finasteride (PROSCAR) 5 mg tablet Take one tablet by mouth daily.    furosemide (LASIX) 40 mg tablet Take one tablet by mouth daily.    HYDROcodone/acetaminophen (NORCO) 5/325 mg tablet Take 1-2 Tabs by mouth every 4 hours as needed for Pain (Patient taking differently: Take one tablet to two tablets by mouth every 4 hours as needed for Pain.  PRN USE (medication a few years old and still uses if needed but does not like how it makes him feel))    losartan (COZAAR) 50 mg tablet Take one tablet by mouth daily.    metoprolol succinate XL (TOPROL XL) 50 mg extended release tablet Take one tablet by mouth twice daily.    nitroglycerin (NITROSTAT) 0.4 mg tablet PLACE 1 TABLET UNDER TONGUE EVERY 5 MINUTES AS NEEDED FOR CHEST PAIN. MAY REPEAT FOR 3  DOSES    omeprazole DR (PRILOSEC) 20 mg capsule Take one capsule by mouth daily before breakfast.    tamsulosin (FLOMAX) 0.4 mg capsule Take one capsule by mouth daily.    VIT C/VIT E ACETATE/LUTEIN/MIN (OCUVITE LUTEIN PO) Take 1 Tab by mouth daily.    warfarin (COUMADIN) 5 mg tablet Take 1 Tab by mouth at bedtime daily. as directed based on INR.

## 2022-07-14 ENCOUNTER — Ambulatory Visit: Admit: 2022-07-14 | Discharge: 2022-07-14 | Payer: MEDICARE

## 2022-07-14 ENCOUNTER — Encounter: Admit: 2022-07-14 | Discharge: 2022-07-14 | Payer: MEDICARE

## 2022-07-14 DIAGNOSIS — E7800 Pure hypercholesterolemia, unspecified: Secondary | ICD-10-CM

## 2022-07-14 DIAGNOSIS — I10 Essential (primary) hypertension: Secondary | ICD-10-CM

## 2022-07-14 DIAGNOSIS — Z95810 Presence of automatic (implantable) cardiac defibrillator: Secondary | ICD-10-CM

## 2022-07-14 DIAGNOSIS — I2510 Atherosclerotic heart disease of native coronary artery without angina pectoris: Secondary | ICD-10-CM

## 2022-07-14 DIAGNOSIS — Z905 Acquired absence of kidney: Secondary | ICD-10-CM

## 2022-07-14 DIAGNOSIS — Z7901 Long term (current) use of anticoagulants: Secondary | ICD-10-CM

## 2022-07-14 DIAGNOSIS — Z8679 Personal history of other diseases of the circulatory system: Secondary | ICD-10-CM

## 2022-07-14 DIAGNOSIS — I447 Left bundle-branch block, unspecified: Secondary | ICD-10-CM

## 2022-07-14 DIAGNOSIS — I480 Paroxysmal atrial fibrillation: Secondary | ICD-10-CM

## 2022-07-14 DIAGNOSIS — T8092XA Unspecified transfusion reaction, initial encounter: Secondary | ICD-10-CM

## 2022-07-14 DIAGNOSIS — E785 Hyperlipidemia, unspecified: Secondary | ICD-10-CM

## 2022-07-14 DIAGNOSIS — Z136 Encounter for screening for cardiovascular disorders: Secondary | ICD-10-CM

## 2022-07-14 DIAGNOSIS — I255 Ischemic cardiomyopathy: Secondary | ICD-10-CM

## 2022-07-14 MED ORDER — METOPROLOL SUCCINATE 50 MG PO TB24
50 mg | ORAL_TABLET | Freq: Two times a day (BID) | ORAL | 3 refills | 90.00000 days | Status: AC
Start: 2022-07-14 — End: ?

## 2022-07-14 MED ORDER — LOSARTAN 50 MG PO TAB
50 mg | ORAL_TABLET | Freq: Every day | ORAL | 3 refills | 90.00000 days | Status: AC
Start: 2022-07-14 — End: ?

## 2022-07-31 ENCOUNTER — Encounter: Admit: 2022-07-31 | Discharge: 2022-07-31 | Payer: MEDICARE

## 2022-07-31 DIAGNOSIS — Z95810 Presence of automatic (implantable) cardiac defibrillator: Secondary | ICD-10-CM

## 2022-08-05 ENCOUNTER — Encounter: Admit: 2022-08-05 | Discharge: 2022-08-05 | Payer: MEDICARE

## 2022-08-06 ENCOUNTER — Encounter: Admit: 2022-08-06 | Discharge: 2022-08-06 | Payer: MEDICARE

## 2022-08-06 NOTE — Telephone Encounter
Sonia Baller, RN  P Cvm Nurse Gen Card Team [place]  SHT: pt had remote 07/30/22 that suggested +heart failure, pls f/u w/ pt - & provider. thanks!

## 2022-08-06 NOTE — Telephone Encounter
I called pt. to assess for worsening HF symptoms. Pt. reports taking his medications dailey and denied any recent weight gain, swelling or SOA. I advised pt. to call our office back if he were to notice any of those symptoms worsening and we would discuss changes in his diuretics. Pt. verbalized understanding and denied any other questions at this time.

## 2022-08-23 ENCOUNTER — Encounter: Admit: 2022-08-23 | Discharge: 2022-08-23 | Payer: MEDICARE

## 2022-10-06 ENCOUNTER — Encounter: Admit: 2022-10-06 | Discharge: 2022-10-06 | Payer: MEDICARE

## 2022-10-22 ENCOUNTER — Encounter: Admit: 2022-10-22 | Discharge: 2022-10-22 | Payer: MEDICARE

## 2022-10-28 ENCOUNTER — Encounter: Admit: 2022-10-28 | Discharge: 2022-10-28 | Payer: MEDICARE

## 2022-10-28 DIAGNOSIS — I480 Paroxysmal atrial fibrillation: Secondary | ICD-10-CM

## 2022-10-28 MED ORDER — METOPROLOL SUCCINATE 50 MG PO TB24
ORAL_TABLET | ORAL | 3 refills | 90.00000 days | Status: AC
Start: 2022-10-28 — End: ?

## 2022-10-30 ENCOUNTER — Encounter: Admit: 2022-10-30 | Discharge: 2022-10-30 | Payer: MEDICARE

## 2022-12-10 ENCOUNTER — Encounter: Admit: 2022-12-10 | Discharge: 2022-12-11 | Payer: MEDICARE

## 2022-12-10 ENCOUNTER — Encounter: Admit: 2022-12-10 | Discharge: 2022-12-10 | Payer: MEDICARE

## 2022-12-10 NOTE — Telephone Encounter
I called pt. to assess for fluid overload based on latest device check. Pt. denied any worsening SOA or LE edema. Pt. does endorse some higher BP readings of 190's/100's. Pt. states he has been taking clonidine 0.1mg  a few times when it is that high. Pt. reports being compliant on all his medications and denied any thing else he can think of that would be causing this increase in BP. Pt. requesting a refill on his clonidine 0.1mg . Based on his chart we stopped his clonidine back in 2023 so I let him know we couldn't refill it at this time. I did let pt. know that we would discuss with Dr. Harvel Ricks and see what she had to recommend. Pt. verbalized understanding and denied any other questions at this time.

## 2022-12-10 NOTE — Telephone Encounter
Sherian Maroon, RN  P Cvm Nurse Gen Card Team [place]  Unscheduled remote on 11/17 shows Optivol fluid index rising and crossed threshold on 11/16 implying possible early HF.  Pls f/u with pt as needed. thx.

## 2023-01-11 ENCOUNTER — Encounter: Admit: 2023-01-11 | Discharge: 2023-01-12 | Payer: MEDICARE

## 2023-02-07 ENCOUNTER — Ambulatory Visit: Admit: 2023-02-07 | Discharge: 2023-02-08 | Payer: MEDICARE

## 2023-04-02 ENCOUNTER — Encounter: Admit: 2023-04-02 | Discharge: 2023-04-02 | Payer: MEDICARE

## 2023-04-03 IMAGING — DX XR chest 1V portable
1 series · 1 of 1 positions shown · non-contrast
Comparison: none

Procedure(s): XR chest 1V portable

CHEST X-RAY, single view
CLINICAL DATA: Chest pain.

[chest ap]
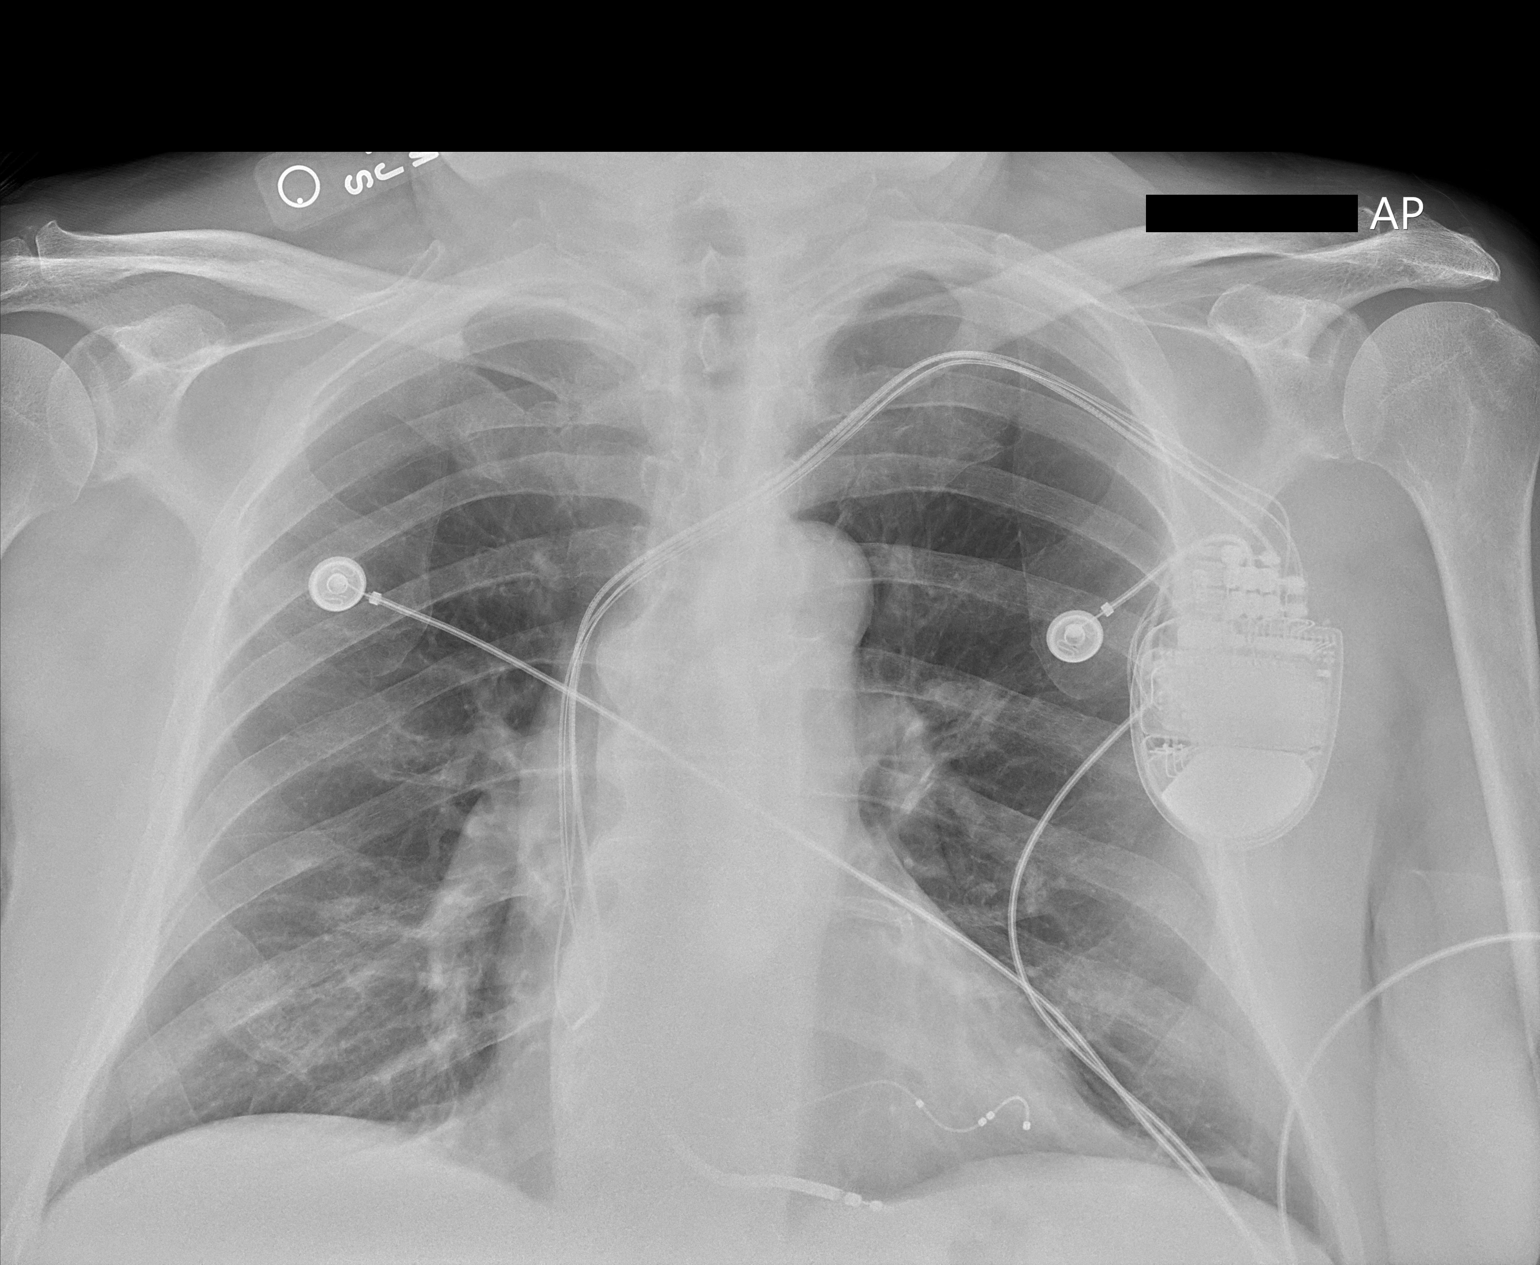

[1 of 1 positions shown; findings below may reference images not displayed]

FINDINGS: Cardiac pacemaker seen overlying left chest.
The heart is normal sized. The mediastinum is unremarkable.
The hila show no
obvious abnormalities. No pneumothorax is seen. No
consolidations or pleural
effusions are detected.
IMPRESSION: No acute cardiopulmonary process detected.

## 2023-04-30 ENCOUNTER — Encounter: Admit: 2023-04-30 | Discharge: 2023-04-30

## 2023-04-30 MED ORDER — ATORVASTATIN 40 MG PO TAB
40 mg | ORAL_TABLET | Freq: Every day | ORAL | 3 refills | Status: AC
Start: 2023-04-30 — End: ?

## 2023-04-30 NOTE — Telephone Encounter
 Advised patient to seek the emergency room since he has had fluid overload noted on recent remote and when we discussed this earlier around 3pm, he had no concerns and now is experiencing chest pain. He was agreeable. No further questions. He will proceed to his closet ED.

## 2023-04-30 NOTE — Telephone Encounter
 Pt called as he  was having CP and sent a remote and took a SLN.     Initially he says his pain was 3. After the nitro, the pain was gone, but he was concerned.     The remote detected with few seconds of NSVT yesterday 04/29/23 but marked fluid overload is indicated.     We reviewed remote findings. Pt was still pain free at the time of this call.     We reviewed how to take SLN, and if he required a 3rd table due to the pain returning, he needed to call 911.   The pt agreed and was quite concerned.   He verbalized understanding and agreed call 911 if he needed 3 SLN.

## 2023-04-30 NOTE — Telephone Encounter
 I called pt. to assess for fluid overload based on latest device check. Pt. denied any worsening SOA or LE edema. No concerns at this time. He will let us  know if anything changes.

## 2023-04-30 NOTE — Telephone Encounter
-----   Message from [place] K sent at 04/30/2023 10:23 AM CDT -----  Regarding: SHT pt remote shows ongoing fl overload  4/9 remote showing ? Possible OptiVol fluid accumulation: 27-Feb-2023 -- ongoing.    Please f/u as needed.    Thanks,  Kaylee/device team

## 2023-05-01 ENCOUNTER — Encounter: Admit: 2023-05-01 | Discharge: 2023-05-01 | Payer: MEDICARE

## 2023-06-19 ENCOUNTER — Encounter: Admit: 2023-06-19 | Discharge: 2023-06-19 | Payer: MEDICARE

## 2023-06-19 ENCOUNTER — Ambulatory Visit: Admit: 2023-06-19 | Discharge: 2023-06-19 | Payer: MEDICARE

## 2023-06-19 DIAGNOSIS — I447 Left bundle-branch block, unspecified: Secondary | ICD-10-CM

## 2023-06-19 DIAGNOSIS — I480 Paroxysmal atrial fibrillation: Secondary | ICD-10-CM

## 2023-06-19 DIAGNOSIS — Z95810 Presence of automatic (implantable) cardiac defibrillator: Secondary | ICD-10-CM

## 2023-06-19 DIAGNOSIS — I255 Ischemic cardiomyopathy: Secondary | ICD-10-CM

## 2023-06-19 NOTE — Patient Instructions
 Your heart, vascular, rhythm, and device findings are all very stable.  Continue with your current medications.  Contact our office with any questions or concerns  Routine follow-up can be in 6 months.

## 2023-06-19 NOTE — Progress Notes
 Date of Service: 06/19/2023    Justin Farmer is a 83 y.o. male.       HPI     The patient is an 83 year old with cardiomyopathy and a biventricular ICD who presents for a device check and medication management.    He has a history of cardiomyopathy with a biventricular ICD and a recovered left ventricular ejection fraction. His device check shows atrial pacing at approximately 40% and greater than 98% pacing in both the right and left ventricles. His last stress test in 2022 showed no significant ischemia with an estimated left ventricular ejection fraction of 54%.    He has paroxysmal atrial fibrillation and is on chronic anticoagulation with warfarin, taking 4 mg daily, with a clotting time of 3.1 last month. No episodes of racing or skipping heartbeats.    He experiences occasional chest discomfort, described as being more 'up there' in the chest area, relieved by nitroglycerin, which he last used a couple of weeks ago.    He reports issues with polypharmacy but is currently compliant and tolerant of his medications. He takes metoprolol later in the day to limit lightheadedness and dizzy episodes. He forgot to take his blood pressure medication this morning, which he attributes to his elevated blood pressure at the nurse check-in.    His lipid profile in February showed total cholesterol of 131, triglycerides of 230, HDL of 29, and LDL of 56. His comprehensive metabolic profile shows stable creatinine at 1.5 and glucose at 122.             Vitals:    06/19/23 1112   BP: (!) 160/100   BP Source: Arm, Left Upper   Pulse: 66   SpO2: 96%   O2 Device: None (Room air)   PainSc: Zero   Weight: 103.6 kg (228 lb 6.4 oz)   Height: 177.8 cm (5' 10)     Body mass index is 32.77 kg/m?Aaron Aas     Past Medical History  Patient Active Problem List    Diagnosis Date Noted    Other cardiomyopathies (CMS-HCC) 09/27/2020    Melena 11/05/2019    Transfusion reaction 11/01/2019       Allergic Transfusion Reaction    NAME:Justin Farmer             MRN: [id number]             DOB:02-19-1940          AGE: 83 y.o.  ADMISSION DATE: 10/30/2019             DAYS ADMITTED: LOS: 1 day    Justin Farmer is 83 y.o. male with history of INR 3.1 with obstructive jaundice secondary to choledocholithiasis on warfarin for A Fib who received 223 mL of plasma with no premedication.  Patient developed rash, pruritus and hives on arm and torso 60 minutes after the transfusion started.  Patient did not have stridor and wheezing.     Vital Signs:  Temperature: 36.9C to 36.8C; Blood Pressure: 135/59 mmHg to 145/9mmHg; Pulse: 64 to 70; Respirations: 15 to 22; O2Sat: 97% to 95% on Room Air.  Patient was treated with diphenhydramine and symptoms resolved quickly.  Blood bank investigation for hemolysis was negative.    Physician Interpretation:   Allergic Reaction.    Severity: Non-severe    Imputability: Definite    Recommendation:   (1) Resumption of current transfusion is acceptable if symptoms resolved quickly.   (2) Premedication with antihistamine 45 minutes  to 1 hour before future transfusion may be beneficial.                Common bile duct dilatation 10/31/2019    Left rotator cuff tear 01/26/2017    Biventricular automatic implantable cardioverter defibrillator in situ 07/14/2016    Coronary artery disease involving native coronary artery of native heart without angina pectoris 12/08/2014     17/07 pharmacologic stress study: Mildly abnormal with slight degree of ischemia in mid to distal inferolateral wall in circumflex distribution. EF 49%, although visually appeared to be 55%. EDV 87 mL.  02/14/05 cardiac catheterization: LAD proximal 40-50% stenosis, proximal mid portion 60% followed by 70-75% stenosis between the first and second diagonal branches. Second diagonal branch 60% ostial stenosis. Circumflex first OM with proximal 50-60% stenosis. RCA 20-30% luminal irregularity in the proximal mid segment. Normal end-diastolic pressure 6-8 mmHg. Left ventriculogram not performed.  05/08/05 coronary angiogram: Left main free of significant disease, LAD proximal 30-40% stenosis followed by 70% mid stenosis between the first and second diagonal branches, 50% stenosis in the LAD at bifurcation or second diagonal branch. Second diagonal branch ostial 60% stenosis. Left circumflex no significant disease. Large obtuse marginal branch proximal 40% stenosis. Right coronary artery 20% luminal narrowing in mid RCA. FFR proximal-mid LAD at baseline 0.90, at maximal hyperemia 0.72. FFR of first obtuse marginal 0.97 at rest, decreased to 0.91 with maximum hyperemia with adenosine.  Status post PCI and stenting of proximal to mid left anterior descending artery with 3.0- x 30.0-mm Driver bare-metal stent.  05/11/2006, exercise thallium.  The patient exercised for 8:33 minutes,achieving 89 percent of MPHR.  Stress thallium negative for ischemia.  His stress ECG negative for ischemia.  EF 59 %.  06/08/08: Lexicon stress thallium: No significant myocardial ischemia. There is diaphragmatic attenuation/and due to hot spot artifact involving the inferior wall. EF 66%  07/18/11: Probably abnormal and demonstrates a relatively small, reversible anteroseptal and apical septal defect. There is also a fixed, true apical component noted. This may suggest limited ischemia in an LAD distribution. EF 53%  08/03/11: Cardiac Cath: Moderate 70% lesion right after the previously placed stent in the proximal LAD.FFR dropped from 0.86 to 0.67. Moderate 40 percent to 50 percent lesion in the large first obtuse marginal vessel, mild 30 percent lesion in the proximal to mid RCA.LAD- stented using a bare-metal stent, Integrity 3.0 x 12 mm and followed by another stent of 2.5 x 12 mm in overlapping fashion.  08/25/11 Echo:  EF 40-45%.  Mild left atrial enlargement.  Mild concentric LVH.  Calcific thickening of noncoronary cusp of aortic valve with no stenosis or  regurgitation.  PAP=34 mmHg.  06/09/13 Regadenoson Thallium:  EF 42%.  No definite perfusion defects.  09/18/2016: Reg stress thall: EF 55%. probably normal with no evidence of significant myocardial ischemia. The mild fixed reduction in inferolateral/apical lateral tracer activity is probably related to diaphragmatic/soft tissue attenuation      Chronic anticoagulation 12/08/2014    Ischemic cardiomyopathy 01/14/2012     08/25/11: Echo:EF=40- 45%. Mild left atrial enlargement. Mild LVH. Calcific thickening of noncoronary cusp of aortic valve. No stenosis or regurgitation. PAP 34  10/06/12 Echo:  EF 45%.  LA mildly enlarged.  10/06/12: Echo: LV EF is about 45%. The LA is mildly enlarged. Mild MR/TR  12/08/14 Echo:  EF 35%. Sclerotic AV with not stenosis or regurgitation.  Mildly dilated sinuses of valsalva -3.7 cm. PAP=27 mmHg.  01/02/2015 CRT-D implant  07/19/15 2D Echo:  EF 40%.  Sclerotic AV.  Mildly dilated sinuses al valsalva (3.9 cm)  08/13/18: Echo: EF 55%.Interventricular septal motion consistent with pacing. Grade I diastolic dysfunction. No significant valvular abnormality. PASP 26.          Abnormal cardiovascular stress test 07/31/2011    Abnormal thallium stress test 07/31/2011    Paroxysmal atrial fibrillation (HCC) 02/22/2011     01/2011: TEE: EF 40-45%. Moderate left atrial enlargement. Mild right atrial enlargement. Mild MR/TR. No thrombus is noted in the left atrial appendage or the left atrium.   02/21/11: Cardioversion: 200 J biphasic  INR followed by PCP, Cozette Divine MD - as of 08/05/11  11/06/14 Holter Monitor:  46 hour Holter monitor demonstrates sinus rhythm with rare premature atrial complex beats (21 in 46 hours) and rare premature ventricular     complex beats (29 in 46 hours).  No episode of significant atrial or ventricular sustained arrhythmia.  No symptoms reported.         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   HENT: Negative.     Eyes: Negative.    Cardiovascular: Negative.    Respiratory: Negative.     Endocrine: Negative.    Hematologic/Lymphatic: Negative.    Skin: Negative.    Musculoskeletal: Negative.    Gastrointestinal: Negative.    Genitourinary: Negative.    Neurological: Negative.    Psychiatric/Behavioral: Negative.     Allergic/Immunologic: Negative.    All other systems reviewed and are negative.      Physical Exam  Awake and alert, elderly man.  Somewhat pale but no acute distress and comfortable.  Accompanied by his son  Pupils are equal reactive without scleral injection  Neck is supple normal carotid upstroke no obvious bruit, mass, or jugular venous abnormality, does have excess of skin  Chest is symmetric and device is stable in the left upper chest with minimal mobility, mild prominence of superficial small veins  Lungs are clear to auscultation  Heart S1, S2 within normal without significant murmur, click, or gallop  Abdomen is mildly prominent but soft and nontender  Pulse are 2+ and regular radial locations with no significant peripheral edema    Cardiovascular Studies      Cardiovascular Health Factors  Vitals BP Readings from Last 3 Encounters:   06/19/23 (!) 160/100   07/14/22 (!) 116/90   12/02/21 (!) 128/90     Wt Readings from Last 3 Encounters:   06/19/23 103.6 kg (228 lb 6.4 oz)   07/14/22 97.5 kg (215 lb)   12/02/21 99.8 kg (220 lb)     BMI Readings from Last 3 Encounters:   06/19/23 32.77 kg/m?   07/14/22 31.75 kg/m?   12/02/21 32.49 kg/m?      Smoking Social History     Tobacco Use   Smoking Status Never   Smokeless Tobacco Never      Lipid Profile Cholesterol   Date Value Ref Range Status   03/05/2023 131  Final     HDL   Date Value Ref Range Status   03/05/2023 29  Final LDL   Date Value Ref Range  Status   03/05/2023 56  Final     Triglycerides   Date Value Ref Range Status   03/05/2023 230 (A) <=140 Final      Blood Sugar Hemoglobin A1C   Date Value Ref Range Status   10/31/2019 6.8 (H) 4.0 - 6.0 % Final     Comment:     The ADA recommends that most patients with type 1 and type 2 diabetes maintain   an A1c level <7%.       Glucose   Date Value Ref Range Status   03/05/2023 122 (A) <=100 Final   08/12/2021 138 (H)  Final   06/03/2021 137 (H)  Final     Glucose, POC   Date Value Ref Range Status   02/21/2011 160 (H) 70 - 100 MG/DL Final   07/37/1062 694 (H) 70 - 100 MG/DL Final   85/46/2703 500 (H) 70 - 100 MG/DL Final          Problems Addressed Today  Encounter Diagnoses   Name Primary?    Paroxysmal atrial fibrillation (CMS-HCC) Yes    LBBB (left bundle branch block)     Biventricular automatic implantable cardioverter defibrillator in situ     Ischemic cardiomyopathy        Assessment and Plan     Cardiomyopathy with biventricular ICD  Device check shows atrial pacing at 40% and biventricular pacing over 98%. No significant arrhythmias or conduction concerns. Battery replacement anticipated in December or January.  - Monitor ICD battery life and schedule replacement upon low battery notification.  - Continue current management and monitoring of ICD function.    Paroxysmal atrial fibrillation  Managed with chronic anticoagulation. INR last month was 3.1, slightly above target but acceptable. Target INR range is 2.0 to 3.5, with preference for 2.0 to 3.0.  - Continue warfarin with target INR range of 2.0 to 3.5.  - Monitor INR levels regularly.    Hypertension  Recent elevated blood pressure likely due to missed medication. Management complicated by polypharmacy and previous medication-induced hypotension. Current regimen effective when adhered to.  - Ensure adherence to antihypertensive medication regimen.         Current Medications (including today's revisions)   aspirin EC 81 mg tablet Take 1 Tab by mouth daily.    atorvastatin (LIPITOR) 40 mg tablet TAKE ONE TABLET (40MG ) BY MOUTH DAILY.    colestipoL (COLESTID) 1 gram tablet Take one tablet by mouth daily.    finasteride (PROSCAR) 5 mg tablet Take one tablet by mouth daily.    furosemide (LASIX) 40 mg tablet Take one tablet by mouth daily.    HYDROcodone/acetaminophen (NORCO) 5/325 mg tablet Take 1-2 Tabs by mouth every 4 hours as needed for Pain (Patient taking differently: Take one tablet to two tablets by mouth every 4 hours as needed for Pain.  PRN USE (medication a few years old and still uses if needed but does not like how it makes him feel))    losartan (COZAAR) 50 mg tablet Take one tablet by mouth daily.    metoprolol succinate XL (TOPROL XL) 50 mg extended release tablet TAKE ONE TABLET (50MG ) BY MOUTH TWICE DAILY    nitroglycerin (NITROSTAT) 0.4 mg tablet PLACE 1 TABLET UNDER TONGUE EVERY 5 MINUTES AS NEEDED FOR CHEST PAIN. MAY REPEAT FOR 3 DOSES    omeprazole DR (PRILOSEC) 20 mg capsule Take one capsule by mouth daily before breakfast.    tamsulosin (FLOMAX) 0.4 mg capsule Take one capsule by mouth  daily.    VIT C/VIT E ACETATE/LUTEIN/MIN (OCUVITE LUTEIN PO) Take 1 Tab by mouth daily.    warfarin (COUMADIN) 5 mg tablet Take 1 Tab by mouth at bedtime daily. as directed based on INR.

## 2023-07-02 ENCOUNTER — Encounter: Admit: 2023-07-02 | Discharge: 2023-07-02 | Payer: MEDICARE

## 2023-07-02 MED ORDER — LOSARTAN 50 MG PO TAB
50 mg | ORAL_TABLET | Freq: Every day | ORAL | 3 refills | 90.00000 days | Status: AC
Start: 2023-07-02 — End: ?

## 2023-07-27 ENCOUNTER — Encounter: Admit: 2023-07-27 | Discharge: 2023-07-27 | Payer: MEDICARE

## 2023-07-27 DIAGNOSIS — Z95810 Presence of automatic (implantable) cardiac defibrillator: Secondary | ICD-10-CM

## 2023-07-27 DIAGNOSIS — I255 Ischemic cardiomyopathy: Principal | ICD-10-CM

## 2023-08-05 ENCOUNTER — Encounter: Admit: 2023-08-05 | Discharge: 2023-08-05 | Payer: MEDICARE

## 2023-09-04 ENCOUNTER — Encounter: Admit: 2023-09-04 | Discharge: 2023-09-04 | Payer: MEDICARE

## 2023-11-01 ENCOUNTER — Encounter: Admit: 2023-11-01 | Discharge: 2023-10-31 | Payer: MEDICARE

## 2023-12-01 ENCOUNTER — Encounter: Admit: 2023-12-01 | Discharge: 2023-12-01 | Payer: MEDICARE

## 2023-12-29 ENCOUNTER — Encounter: Admit: 2023-12-29 | Discharge: 2023-12-29 | Payer: MEDICARE

## 2023-12-29 ENCOUNTER — Ambulatory Visit: Admit: 2023-12-29 | Discharge: 2023-12-29 | Payer: MEDICARE

## 2023-12-29 VITALS — BP 127/57 | HR 65 | Ht 69.0 in | Wt 225.0 lb

## 2023-12-29 DIAGNOSIS — I447 Left bundle-branch block, unspecified: Secondary | ICD-10-CM

## 2023-12-29 DIAGNOSIS — I255 Ischemic cardiomyopathy: Secondary | ICD-10-CM

## 2023-12-29 DIAGNOSIS — Z95810 Presence of automatic (implantable) cardiac defibrillator: Principal | ICD-10-CM

## 2023-12-29 DIAGNOSIS — I480 Paroxysmal atrial fibrillation: Secondary | ICD-10-CM

## 2023-12-29 NOTE — Progress Notes [1]
 Date of Service: 12/29/2023    Justin Farmer is a 83 y.o. male.       HPI     Kolbey Teichert is an 83 year old male with cardiomyopathy and a biventricular ICD who presents for a device check and battery replacement planning.    He has a history of cardiomyopathy and has had a biventricular ICD in place since 2016. The device check shows consistent biventricular pacing with a basal rate of 60 beats per minute. He paces at about 40% in the atrium and over 98% in the ventricles. The battery of the device is running low, and replacement is anticipated in the next few months.    He has a history of proximal atrial fibrillation and is on chronic anticoagulation therapy with warfarin. He experiences lightheadedness, but it has been relatively stable. No significant atrial fibrillation, atrial flutter, or other arrhythmias noted.    His ejection fraction was last checked in 2020, showing recovery to 54% by stress test with no ischemia. He also has chronic renal insufficiency with a baseline creatinine of approximately 1.5, but no significant electrolyte concerns or events have been reported.    No chest pain, shortness of breath, or swelling. His weight is stable, and he does not experience swelling. He sleeps laying flat on his side without needing to prop up excessively.       Objective   Vitals:    12/29/23 1451   BP: 127/57   BP Source: Arm, Left Upper   Pulse: 65   SpO2: 97%   O2 Device: None (Room air)   Weight: 102.1 kg (225 lb)   Height: 175.3 cm (5' 9)     Body mass index is 33.23 kg/m?SABRA     Past Medical History  Patient Active Problem List    Diagnosis Date Noted    Other cardiomyopathies (CMS-HCC) 09/27/2020    Melena 11/05/2019    Transfusion reaction 11/01/2019       Allergic Transfusion Reaction    NAME:Ferron Rigel Filsinger             MRN: [id number]             DOB:07/13/1940          AGE: 83 y.o.  ADMISSION DATE: 10/30/2019             DAYS ADMITTED: LOS: 1 day    Cote Mayabb is 83 y.o. male with history of INR 3.1 with obstructive jaundice secondary to choledocholithiasis on warfarin for A Fib who received 223 mL of plasma with no premedication.  Patient developed rash, pruritus and hives on arm and torso 60 minutes after the transfusion started.  Patient did not have stridor and wheezing.     Vital Signs:  Temperature: 36.9C to 36.8C; Blood Pressure: 135/59 mmHg to 145/73mmHg; Pulse: 64 to 70; Respirations: 15 to 22; O2Sat: 97% to 95% on Room Air.  Patient was treated with diphenhydramine  and symptoms resolved quickly.  Blood bank investigation for hemolysis was negative.    Physician Interpretation:   Allergic Reaction.    Severity: Non-severe    Imputability: Definite    Recommendation:   (1) Resumption of current transfusion is acceptable if symptoms resolved quickly.   (2) Premedication with antihistamine 45 minutes to 1 hour before future transfusion may be beneficial.                Common bile duct dilatation 10/31/2019    Left rotator cuff tear 01/26/2017  Biventricular automatic implantable cardioverter defibrillator in situ 07/14/2016    Coronary artery disease involving native coronary artery of native heart without angina pectoris 12/08/2014     17/07 pharmacologic stress study: Mildly abnormal with slight degree of ischemia in mid to distal inferolateral wall in circumflex distribution. EF 49%, although visually appeared to be 55%. EDV 87 mL.  02/14/05 cardiac catheterization: LAD proximal 40-50% stenosis, proximal mid portion 60% followed by 70-75% stenosis between the first and second diagonal branches. Second diagonal branch 60% ostial stenosis. Circumflex first OM with proximal 50-60% stenosis. RCA 20-30% luminal irregularity in the proximal mid segment. Normal end-diastolic pressure 6-8 mmHg. Left ventriculogram not performed.  05/08/05 coronary angiogram: Left main free of significant disease, LAD proximal 30-40% stenosis followed by 70% mid stenosis between the first and second diagonal branches, 50% stenosis in the LAD at bifurcation or second diagonal branch. Second diagonal branch ostial 60% stenosis. Left circumflex no significant disease. Large obtuse marginal branch proximal 40% stenosis. Right coronary artery 20% luminal narrowing in mid RCA. FFR proximal-mid LAD at baseline 0.90, at maximal hyperemia 0.72. FFR of first obtuse marginal 0.97 at rest, decreased to 0.91 with maximum hyperemia with adenosine.  Status post PCI and stenting of proximal to mid left anterior descending artery with 3.0- x 30.0-mm Driver bare-metal stent.  05/11/2006, exercise thallium.  The patient exercised for 8:33 minutes,achieving 89 percent of MPHR.  Stress thallium negative for ischemia.  His stress ECG negative for ischemia.  EF 59 %.  06/08/08: Lexicon stress thallium: No significant myocardial ischemia. There is diaphragmatic attenuation/and due to hot spot artifact involving the inferior wall. EF 66%  07/18/11: Probably abnormal and demonstrates a relatively small, reversible anteroseptal and apical septal defect. There is also a fixed, true apical component noted. This may suggest limited ischemia in an LAD distribution. EF 53%  08/03/11: Cardiac Cath: Moderate 70% lesion right after the previously placed stent in the proximal LAD.FFR dropped from 0.86 to 0.67. Moderate 40 percent to 50 percent lesion in the large first obtuse marginal vessel, mild 30 percent lesion in the proximal to mid RCA.LAD- stented using a bare-metal stent, Integrity 3.0 x 12 mm and followed by another stent of 2.5 x 12 mm in overlapping fashion.  08/25/11 Echo:  EF 40-45%.  Mild left atrial enlargement.  Mild concentric LVH.  Calcific thickening of noncoronary cusp of aortic valve with no stenosis or  regurgitation.  PAP=34 mmHg.  06/09/13 Regadenoson  Thallium:  EF 42%.  No definite perfusion defects.  09/18/2016: Reg stress thall: EF 55%. probably normal with no evidence of significant myocardial ischemia. The mild fixed reduction in inferolateral/apical lateral tracer activity is probably related to diaphragmatic/soft tissue attenuation      Chronic anticoagulation 12/08/2014    Ischemic cardiomyopathy 01/14/2012     08/25/11: Echo:EF=40- 45%. Mild left atrial enlargement. Mild LVH. Calcific thickening of noncoronary cusp of aortic valve. No stenosis or regurgitation. PAP 34  10/06/12 Echo:  EF 45%.  LA mildly enlarged.  10/06/12: Echo: LV EF is about 45%. The LA is mildly enlarged. Mild MR/TR  12/08/14 Echo:  EF 35%. Sclerotic AV with not stenosis or regurgitation.  Mildly dilated sinuses of valsalva -3.7 cm. PAP=27 mmHg.  01/02/2015 CRT-D implant  07/19/15 2D Echo:  EF 40%.  Sclerotic AV.  Mildly dilated sinuses al valsalva (3.9 cm)  08/13/18: Echo: EF 55%.Interventricular septal motion consistent with pacing. Grade I diastolic dysfunction. No significant valvular abnormality. PASP 26.  Abnormal cardiovascular stress test 07/31/2011    Abnormal thallium stress test 07/31/2011    Paroxysmal atrial fibrillation (HCC) 02/22/2011     01/2011: TEE: EF 40-45%. Moderate left atrial enlargement. Mild right atrial enlargement. Mild MR/TR. No thrombus is noted in the left atrial appendage or the left atrium.   02/21/11: Cardioversion: 200 J biphasic  INR followed by PCP, Powell Metro MD - as of 08/05/11  11/06/14 Holter Monitor:  46 hour Holter monitor demonstrates sinus rhythm with rare premature atrial complex beats (21 in 46 hours) and rare premature ventricular     complex beats (29 in 46 hours).  No episode of significant atrial or ventricular sustained arrhythmia.  No symptoms reported.         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   HENT: Negative.     Eyes: Negative.    Cardiovascular: Negative.    Respiratory: Negative.     Endocrine: Negative.    Hematologic/Lymphatic: Negative.    Skin: Negative.    Musculoskeletal: Negative.    Gastrointestinal: Negative.    Genitourinary: Negative.    Neurological: Negative.    Psychiatric/Behavioral: Negative.     Allergic/Immunologic: Negative.        Physical Exam  Awake and alert, elderly gentleman in no acute distress well-kept.  Accompanied by family  Pupils are equal reactive without scleral injection  Neck is supple normal carotid upstroke no bruit, mass, jugular venous abnormality  Chest is symmetric and lungs clear to auscultation  Heart S1, S2 within normal without murmur, click, gallop  Abdomen is obese but soft and nontender  Pulses are 2+ and regular radial as well as pedal location  Minimal peripheral edema no significant pitting    Cardiovascular Studies    Cardiovascular Health Factors  Vitals BP Readings from Last 3 Encounters:   12/29/23 127/57   06/19/23 (!) 160/100   07/14/22 (!) 116/90     Wt Readings from Last 3 Encounters:   12/29/23 102.1 kg (225 lb)   06/19/23 103.6 kg (228 lb 6.4 oz)   07/14/22 97.5 kg (215 lb)     BMI Readings from Last 3 Encounters:   12/29/23 33.23 kg/m?   06/19/23 32.77 kg/m?   07/14/22 31.75 kg/m?      Smoking Tobacco Use History[1]   Lipid Profile Cholesterol   Date Value Ref Range Status   03/05/2023 131  Final     HDL   Date Value Ref Range Status   03/05/2023 29  Final     LDL   Date Value Ref Range Status   03/05/2023 56  Final     Triglycerides   Date Value Ref Range Status   03/05/2023 230 (A) <=140 Final      Blood Sugar Hemoglobin A1C   Date Value Ref Range Status   10/31/2019 6.8 (H) 4.0 - 6.0 % Final     Comment:     The ADA recommends that most patients with type 1 and type 2 diabetes maintain   an A1c level <7%.       Glucose   Date Value Ref Range  Status   03/05/2023 122 (A) <=100 Final   08/12/2021 138 (H)  Final 06/03/2021 137 (H)  Final     Glucose, POC   Date Value Ref Range Status   02/21/2011 160 (H) 70 - 100 MG/DL Final   97/98/7986 867 (H) 70 - 100 MG/DL Final   97/98/7986 857 (H) 70 - 100 MG/DL Final         Problems Addressed Today  Encounter Diagnoses   Name Primary?    Biventricular automatic implantable cardioverter defibrillator in situ Yes    LBBB (left bundle branch block)     Ischemic cardiomyopathy     Paroxysmal atrial fibrillation (CMS-HCC)        Assessment and Plan    Assessment and Plan  Biventricular cardiomyopathy with ICD (battery nearing elective replacement)  ICD functioning well with consistent biventricular pacing. Battery nearing elective replacement with two months of life remaining. Replacement necessary within next few months.  - Anticipate ICD battery replacement within next few months.  Monitoring device per protocol.  - Ensure incision site remains clean and dry for ten days post-procedure.  - Coordinate with device team for monthly checks and replacement scheduling.  - Discuss afternoon appointment with scheduling team for out-of-town travel.    Chronic atrial fibrillation on anticoagulation  Managed with warfarin. No significant atrial fibrillation or flutter on recent device check. Pacing at 40% in atrium, >98% in ventricles.  - Continue warfarin therapy.    Chronic kidney disease  Baseline creatinine 1.5. No significant electrolyte concerns.         Current Medications (including today's revisions)   aspirin  EC 81 mg tablet Take 1 Tab by mouth daily.    atorvastatin  (LIPITOR) 40 mg tablet TAKE ONE TABLET (40MG ) BY MOUTH DAILY.    colestipoL (COLESTID) 1 gram tablet Take one tablet by mouth daily. (Patient taking differently: Take one tablet by mouth as Needed.)    finasteride  (PROSCAR ) 5 mg tablet Take one tablet by mouth daily.    furosemide (LASIX) 40 mg tablet Take one tablet by mouth daily.    HYDROcodone/acetaminophen  (NORCO) 5/325 mg tablet Take 1-2 Tabs by mouth every 4 hours as needed for Pain (Patient taking differently: Take one tablet to two tablets by mouth every 4 hours as needed for Pain. PRN USE (medication a few years old and still uses if needed but does not like how it makes him feel))    losartan  (COZAAR ) 50 mg tablet TAKE ONE TABLET (50MG ) BY MOUTH DAILY (Patient taking differently: Take one-half tablet by mouth daily.)    metoprolol  succinate XL (TOPROL  XL) 50 mg extended release tablet TAKE ONE TABLET (50MG ) BY MOUTH TWICE DAILY    nitroglycerin  (NITROSTAT ) 0.4 mg tablet PLACE 1 TABLET UNDER TONGUE EVERY 5 MINUTES AS NEEDED FOR CHEST PAIN. MAY REPEAT FOR 3 DOSES    omeprazole DR (PRILOSEC) 20 mg capsule Take one capsule by mouth daily before breakfast.    tamsulosin  (FLOMAX ) 0.4 mg capsule Take one capsule by mouth daily.    VIT C/VIT E ACETATE/LUTEIN/MIN (OCUVITE LUTEIN PO) Take 1 Tab by mouth daily.    warfarin (COUMADIN) 5 mg tablet Take 1 Tab by mouth at bedtime daily. as directed based on INR.                 [1]   Social History  Tobacco Use   Smoking Status Never   Smokeless Tobacco Never

## 2023-12-29 NOTE — Patient Instructions [37]
 Your heart, rhythm, and device findings are all doing very well.  Will continue to monitor your device battery closely for when it is time to get it replaced.  When you are scheduled to have it replaced you can inquire about a convenient date and time that suits you best.  Please reach out with any questions or concerns.  Routine follow-up with me will be in 6 months.    Happy holidays!

## 2024-01-28 ENCOUNTER — Encounter: Admit: 2024-01-28 | Discharge: 2024-01-28 | Payer: MEDICARE

## 2024-01-30 ENCOUNTER — Encounter: Admit: 2024-01-30 | Discharge: 2024-01-30 | Payer: MEDICARE
